# Patient Record
Sex: Male | Born: 1985 | Race: White | Hispanic: No | Marital: Married | Smoking: Light tobacco smoker
Health system: Southern US, Community
[De-identification: ages and names within clinical notes are randomized; demographics above are authoritative.]

## PROBLEM LIST (undated history)

## (undated) DIAGNOSIS — E669 Obesity, unspecified: Secondary | ICD-10-CM

## (undated) DIAGNOSIS — I10 Essential (primary) hypertension: Secondary | ICD-10-CM

## (undated) HISTORY — DX: Essential (primary) hypertension: I10

---

## 2014-08-28 ENCOUNTER — Ambulatory Visit (INDEPENDENT_AMBULATORY_CARE_PROVIDER_SITE_OTHER): Payer: BLUE CROSS/BLUE SHIELD | Admitting: Family Medicine

## 2014-08-28 VITALS — BP 164/102 | HR 96 | Temp 98.9°F | Resp 16 | Ht 71.0 in | Wt 313.4 lb

## 2014-08-28 DIAGNOSIS — E781 Pure hyperglyceridemia: Secondary | ICD-10-CM

## 2014-08-28 DIAGNOSIS — R739 Hyperglycemia, unspecified: Secondary | ICD-10-CM

## 2014-08-28 DIAGNOSIS — I10 Essential (primary) hypertension: Secondary | ICD-10-CM

## 2014-08-28 DIAGNOSIS — E669 Obesity, unspecified: Secondary | ICD-10-CM

## 2014-08-28 LAB — POCT GLYCOSYLATED HEMOGLOBIN (HGB A1C): HEMOGLOBIN A1C: 5.6

## 2014-08-28 LAB — GLUCOSE, POCT (MANUAL RESULT ENTRY): POC Glucose: 90 mg/dl (ref 70–99)

## 2014-08-28 MED ORDER — HYDROCHLOROTHIAZIDE 12.5 MG PO CAPS: 12.5000 mg | ORAL_CAPSULE | Freq: Every day | ORAL | Status: DC

## 2014-08-28 NOTE — Progress Notes (Addendum)
Subjective:    Patient ID: Jeremiah Caldwell, male    DOB: 07/31/1986, 29 y.o.   MRN: [id number]  This chart was scribed for Meredith Staggers, MD by Murriel Hopper, ED Scribe. The patient's care was started at 9:22 PM.   HPI  HPI Comments: Jeremiah Caldwell is a 29 y.o. male who presents for elevated blood pressure. Presents with labs from work lab draw, which was collected June 24, 2014. Creatinine was 1.05 on labs in November from work. Pt states that he went to the dentist 2 weeks ago, where his BP was measured at 165/96, and his dentist recommended that he see a physician. Pt states that he has never been treated for high blood pressure. Pt states that he does not know his FHx of HTN, and only knows that his grandfather had MI around 29 years old. 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 Pt states his weight has gone up in the past year. Pt states that he has done less physical activity at his job in the past 6 months, with more physical activity and exercise before he switched jobs. Pt also notes having headaches frequently, especially when he wakes up in the morning, for which he takes 800 mg Ibuprofen around 4x per week. Pt notes that he has SOB with physical exertion, but denies SOB at rest. Pt denies nocturnal dyspnea, has no PMD, and notes that he lays on two pillows when he sleeps. Rare alcohol use; not regular.    Hyperglycemia: Glucose 105 on labs in November  Hypertriglyceridemia: 218 on November labs  Review of Systems  Constitutional: Negative for fatigue and unexpected weight change.  Eyes: Negative for visual disturbance.  Respiratory: Negative for cough, chest tightness and shortness of breath.   Cardiovascular: Negative for chest pain, palpitations and leg swelling.  Gastrointestinal: Negative for abdominal pain and blood in stool.  Neurological: Negative for dizziness, light-headedness and headaches.       Objective:   Physical Exam  Constitutional: He is oriented to  person, place, and time. He appears well-developed and well-nourished.  HENT:  Head: Normocephalic and atraumatic.  Eyes: EOM are normal. Pupils are equal, round, and reactive to light.  Neck: No JVD present. Carotid bruit is not present.  Cardiovascular: Normal rate, regular rhythm and normal heart sounds.   No murmur heard. Pulmonary/Chest: Effort normal and breath sounds normal. He has no rales.  Musculoskeletal: He exhibits no edema.  Neurological: He is alert and oriented to person, place, and time.  Negative Romberg No pronator drift Negative heel-to-toe  Skin: Skin is warm and dry.  Psychiatric: He has a normal mood and affect.  Vitals reviewed.   Filed Vitals:   08/28/14 2019  BP: 164/102  Pulse: 96  Temp: 98.9 F (37.2 C)  TempSrc: Oral  Resp: 16  Height:  (1.803 m)  Weight: 313 lb 6 oz (142.146 kg)  SpO2: 98%    Results for orders placed or performed in visit on 08/28/14  POCT glucose (manual entry)  Result Value Ref Range   POC Glucose 90 70 - 99 mg/dl  POCT glycosylated hemoglobin (Hb A1C)  Result Value Ref Range   Hemoglobin A1C 5.6        Assessment & Plan:   Jeremiah Caldwell is a 29 y.o. male Essential hypertension - Plan: BASIC METABOLIC PANEL WITH GFR, hydrochlorothiazide (MICROZIDE) 12.5 MG capsule  - start hctz 12.5mg  qd. Labs pending. Recheck with outside readings in next 4 weeks.   -  wt loss, exercise discussed.  Hypertriglyceridemia, Obesity, Hyperglycemia- Plan: POCT glucose (manual entry), POCT glycosylated hemoglobin (Hb A1C)  -- borderline, but not yet prediabetic. Wt loss, exercise discussed. Plan on recheck lipids as wt improves.   Recheck in 4 weeks.      Meds ordered this encounter  Medications  . hydrochlorothiazide (MICROZIDE) 12.5 MG capsule    Sig: Take 1 capsule (12.5 mg total) by mouth daily.    Dispense:  30 capsule    Refill:  1   Patient Instructions  Start diuretic to lower blood pressure. Can also try to lower  salt in diet - see below.  Keep a record of your blood pressures outside of the office and bring them to the next office visit. Activity/exercise 150 minutes per week.  Avoid fast food, soft drinks, sweet tea.  Some form of fiber/complex carbohydrate and protein in the morning.  Recheck in next 4 weeks. We will call to schedule appointment with Dr. Neva Seat.  Return to the clinic or go to the nearest emergency room if any of your symptoms worsen or new symptoms occur.  DASH Eating Plan DASH stands for "Dietary Approaches to Stop Hypertension." The DASH eating plan is a healthy eating plan that has been shown to reduce high blood pressure (hypertension). Additional health benefits may include reducing the risk of type 2 diabetes mellitus, heart disease, and stroke. The DASH eating plan may also help with weight loss. WHAT DO I NEED TO KNOW ABOUT THE DASH EATING PLAN? For the DASH eating plan, you will follow these general guidelines:  Choose foods with a percent daily value for sodium of less than 5% (as listed on the food label).  Use salt-free seasonings or herbs instead of table salt or sea salt.  Check with your health care provider or pharmacist before using salt substitutes.  Eat lower-sodium products, often labeled as "lower sodium" or "no salt added."  Eat fresh foods.  Eat more vegetables, fruits, and low-fat dairy products.  Choose whole grains. Look for the word "whole" as the first word in the ingredient list.  Choose fish and skinless chicken or [place] more often than red meat. Limit fish, poultry, and meat to 6 oz (170 g) each day.  Limit sweets, desserts, sugars, and sugary drinks.  Choose heart-healthy fats.  Limit cheese to 1 oz (28 g) per day.  Eat more home-cooked food and less restaurant, buffet, and fast food.  Limit fried foods.  Cook foods using methods other than frying.  Limit canned vegetables. If you do use them, rinse them well to decrease the  sodium.  When eating at a restaurant, ask that your food be prepared with less salt, or no salt if possible. WHAT FOODS CAN I EAT? Seek help from a dietitian for individual calorie needs. Grains Whole grain or whole wheat bread. Brown rice. Whole grain or whole wheat pasta. Quinoa, bulgur, and whole grain cereals. Low-sodium cereals. Corn or whole wheat flour tortillas. Whole grain cornbread. Whole grain crackers. Low-sodium crackers. Vegetables Fresh or frozen vegetables (raw, steamed, roasted, or grilled). Low-sodium or reduced-sodium tomato and vegetable juices. Low-sodium or reduced-sodium tomato sauce and paste. Low-sodium or reduced-sodium canned vegetables.  Fruits All fresh, canned (in natural juice), or frozen fruits. Meat and Other Protein Products Ground beef (85% or leaner), grass-fed beef, or beef trimmed of fat. Skinless chicken or [place]. Ground chicken or [place]. Pork trimmed of fat. All fish and seafood. Eggs. Dried beans, peas, or lentils. Unsalted nuts and  seeds. Unsalted canned beans. Dairy Low-fat dairy products, such as skim or 1% milk, 2% or reduced-fat cheeses, low-fat ricotta or cottage cheese, or plain low-fat yogurt. Low-sodium or reduced-sodium cheeses. Fats and Oils Tub margarines without trans fats. Light or reduced-fat mayonnaise and salad dressings (reduced sodium). Avocado. Safflower, olive, or canola oils. Natural peanut or almond butter. Other Unsalted popcorn and pretzels. The items listed above may not be a complete list of recommended foods or beverages. Contact your dietitian for more options. WHAT FOODS ARE NOT RECOMMENDED? Grains White bread. White pasta. White rice. Refined cornbread. Bagels and croissants. Crackers that contain trans fat. Vegetables Creamed or fried vegetables. Vegetables in a cheese sauce. Regular canned vegetables. Regular canned tomato sauce and paste. Regular tomato and vegetable juices. Fruits Dried fruits. Canned fruit in  light or heavy syrup. Fruit juice. Meat and Other Protein Products Fatty cuts of meat. Ribs, chicken wings, bacon, sausage, bologna, salami, chitterlings, fatback, hot dogs, bratwurst, and packaged luncheon meats. Salted nuts and seeds. Canned beans with salt. Dairy Whole or 2% milk, cream, half-and-half, and cream cheese. Whole-fat or sweetened yogurt. Full-fat cheeses or blue cheese. Nondairy creamers and whipped toppings. Processed cheese, cheese spreads, or cheese curds. Condiments Onion and garlic salt, seasoned salt, table salt, and sea salt. Canned and packaged gravies. Worcestershire sauce. Tartar sauce. Barbecue sauce. Teriyaki sauce. Soy sauce, including reduced sodium. Steak sauce. Fish sauce. Oyster sauce. Cocktail sauce. Horseradish. Ketchup and mustard. Meat flavorings and tenderizers. Bouillon cubes. Hot sauce. Tabasco sauce. Marinades. Taco seasonings. Relishes. Fats and Oils Butter, stick margarine, lard, shortening, ghee, and bacon fat. Coconut, palm kernel, or palm oils. Regular salad dressings. Other Pickles and olives. Salted popcorn and pretzels. The items listed above may not be a complete list of foods and beverages to avoid. Contact your dietitian for more information. WHERE CAN I FIND MORE INFORMATION? National Heart, Lung, and Blood Institute: CablePromo.itwww.nhlbi.nih.gov/health/health-topics/topics/dash/ Document Released: 07/08/2011 Document Revised: 12/03/2013 Document Reviewed: 05/23/2013 [facility] Patient Information 2015 [place], [place]. This information is not intended to replace advice given to you by your health care provider. Make sure you discuss any questions you have with your health care provider.     I personally performed the services described in this documentation, which was scribed in my presence. The recorded information has been reviewed and considered, and addended by me as needed.

## 2014-08-28 NOTE — Patient Instructions (Addendum)
Start diuretic to lower blood pressure. Can also try to lower salt in diet - see below.  Keep a record of your blood pressures outside of the office and bring them to the next office visit. Activity/exercise 150 minutes per week.  Avoid fast food, soft drinks, sweet tea.  Some form of fiber/complex carbohydrate and protein in the morning.  Recheck in next 4 weeks. We will call to schedule appointment with Dr. Neva SeatGreene.  Return to the clinic or go to the nearest emergency room if any of your symptoms worsen or new symptoms occur.  DASH Eating Plan DASH stands for "Dietary Approaches to Stop Hypertension." The DASH eating plan is a healthy eating plan that has been shown to reduce high blood pressure (hypertension). Additional health benefits may include reducing the risk of type 2 diabetes mellitus, heart disease, and stroke. The DASH eating plan may also help with weight loss. WHAT DO I NEED TO KNOW ABOUT THE DASH EATING PLAN? For the DASH eating plan, you will follow these general guidelines:  Choose foods with a percent daily value for sodium of less than 5% (as listed on the food label).  Use salt-free seasonings or herbs instead of table salt or sea salt.  Check with your health care provider or pharmacist before using salt substitutes.  Eat lower-sodium products, often labeled as "lower sodium" or "no salt added."  Eat fresh foods.  Eat more vegetables, fruits, and low-fat dairy products.  Choose whole grains. Look for the word "whole" as the first word in the ingredient list.  Choose fish and skinless chicken or [place] more often than red meat. Limit fish, poultry, and meat to 6 oz (170 g) each day.  Limit sweets, desserts, sugars, and sugary drinks.  Choose heart-healthy fats.  Limit cheese to 1 oz (28 g) per day.  Eat more home-cooked food and less restaurant, buffet, and fast food.  Limit fried foods.  Cook foods using methods other than frying.  Limit canned  vegetables. If you do use them, rinse them well to decrease the sodium.  When eating at a restaurant, ask that your food be prepared with less salt, or no salt if possible. WHAT FOODS CAN I EAT? Seek help from a dietitian for individual calorie needs. Grains Whole grain or whole wheat bread. Brown rice. Whole grain or whole wheat pasta. Quinoa, bulgur, and whole grain cereals. Low-sodium cereals. Corn or whole wheat flour tortillas. Whole grain cornbread. Whole grain crackers. Low-sodium crackers. Vegetables Fresh or frozen vegetables (raw, steamed, roasted, or grilled). Low-sodium or reduced-sodium tomato and vegetable juices. Low-sodium or reduced-sodium tomato sauce and paste. Low-sodium or reduced-sodium canned vegetables.  Fruits All fresh, canned (in natural juice), or frozen fruits. Meat and Other Protein Products Ground beef (85% or leaner), grass-fed beef, or beef trimmed of fat. Skinless chicken or [place]. Ground chicken or [place]. Pork trimmed of fat. All fish and seafood. Eggs. Dried beans, peas, or lentils. Unsalted nuts and seeds. Unsalted canned beans. Dairy Low-fat dairy products, such as skim or 1% milk, 2% or reduced-fat cheeses, low-fat ricotta or cottage cheese, or plain low-fat yogurt. Low-sodium or reduced-sodium cheeses. Fats and Oils Tub margarines without trans fats. Light or reduced-fat mayonnaise and salad dressings (reduced sodium). Avocado. Safflower, olive, or canola oils. Natural peanut or almond butter. Other Unsalted popcorn and pretzels. The items listed above may not be a complete list of recommended foods or beverages. Contact your dietitian for more options. WHAT FOODS ARE NOT RECOMMENDED? Grains White  bread. White pasta. White rice. Refined cornbread. Bagels and croissants. Crackers that contain trans fat. Vegetables Creamed or fried vegetables. Vegetables in a cheese sauce. Regular canned vegetables. Regular canned tomato sauce and paste. Regular tomato  and vegetable juices. Fruits Dried fruits. Canned fruit in light or heavy syrup. Fruit juice. Meat and Other Protein Products Fatty cuts of meat. Ribs, chicken wings, bacon, sausage, bologna, salami, chitterlings, fatback, hot dogs, bratwurst, and packaged luncheon meats. Salted nuts and seeds. Canned beans with salt. Dairy Whole or 2% milk, cream, half-and-half, and cream cheese. Whole-fat or sweetened yogurt. Full-fat cheeses or blue cheese. Nondairy creamers and whipped toppings. Processed cheese, cheese spreads, or cheese curds. Condiments Onion and garlic salt, seasoned salt, table salt, and sea salt. Canned and packaged gravies. Worcestershire sauce. Tartar sauce. Barbecue sauce. Teriyaki sauce. Soy sauce, including reduced sodium. Steak sauce. Fish sauce. Oyster sauce. Cocktail sauce. Horseradish. Ketchup and mustard. Meat flavorings and tenderizers. Bouillon cubes. Hot sauce. Tabasco sauce. Marinades. Taco seasonings. Relishes. Fats and Oils Butter, stick margarine, lard, shortening, ghee, and bacon fat. Coconut, palm kernel, or palm oils. Regular salad dressings. Other Pickles and olives. Salted popcorn and pretzels. The items listed above may not be a complete list of foods and beverages to avoid. Contact your dietitian for more information. WHERE CAN I FIND MORE INFORMATION? National Heart, Lung, and Blood Institute: CablePromo.it Document Released: 07/08/2011 Document Revised: 12/03/2013 Document Reviewed: 05/23/2013 [facility] Patient Information 2015 [place], [place]. This information is not intended to replace advice given to you by your health care provider. Make sure you discuss any questions you have with your health care provider.

## 2014-08-29 LAB — BASIC METABOLIC PANEL WITH GFR
BUN: 13 mg/dL (ref 6–23)
CALCIUM: 10 mg/dL (ref 8.4–10.5)
CO2: 23 mEq/L (ref 19–32)
CREATININE: 0.97 mg/dL (ref 0.50–1.35)
Chloride: 105 mEq/L (ref 96–112)
GFR, Est African American: >89 mL/min
Glucose, Bld: 97 mg/dL (ref 70–99)
Potassium: 4.3 mEq/L (ref 3.5–5.3)
SODIUM: 141 meq/L (ref 135–145)

## 2014-10-15 ENCOUNTER — Ambulatory Visit (INDEPENDENT_AMBULATORY_CARE_PROVIDER_SITE_OTHER): Payer: BLUE CROSS/BLUE SHIELD

## 2014-10-15 ENCOUNTER — Ambulatory Visit (INDEPENDENT_AMBULATORY_CARE_PROVIDER_SITE_OTHER): Payer: BLUE CROSS/BLUE SHIELD | Admitting: Emergency Medicine

## 2014-10-15 VITALS — BP 132/90 | HR 99 | Temp 98.4°F | Resp 16 | Ht 71.0 in | Wt 310.4 lb

## 2014-10-15 DIAGNOSIS — R1084 Generalized abdominal pain: Secondary | ICD-10-CM

## 2014-10-15 DIAGNOSIS — E669 Obesity, unspecified: Secondary | ICD-10-CM | POA: Diagnosis not present

## 2014-10-15 DIAGNOSIS — I10 Essential (primary) hypertension: Secondary | ICD-10-CM

## 2014-10-15 LAB — POCT CBC
Granulocyte percent: 67.4 %G (ref 37–80)
HEMATOCRIT: 49.3 % (ref 43.5–53.7)
Hemoglobin: 15.7 g/dL (ref 14.1–18.1)
LYMPH, POC: 3 (ref 0.6–3.4)
MCH, POC: 28 pg (ref 27–31.2)
MCHC: 31.9 g/dL (ref 31.8–35.4)
MCV: 87.7 fL (ref 80–97)
MID (CBC): 0.4 (ref 0–0.9)
MPV: 8.9 fL (ref 0–99.8)
POC GRANULOCYTE: 7 — AB (ref 2–6.9)
POC LYMPH %: 28.9 % (ref 10–50)
POC MID %: 3.7 % (ref 0–12)
Platelet Count, POC: 270 10*3/uL (ref 142–424)
RBC: 5.62 M/uL (ref 4.69–6.13)
RDW, POC: 13 %
WBC: 10.4 10*3/uL — AB (ref 4.6–10.2)

## 2014-10-15 LAB — POCT URINALYSIS DIPSTICK
BILIRUBIN UA: NEGATIVE
Blood, UA: NEGATIVE
GLUCOSE UA: NEGATIVE
Ketones, UA: NEGATIVE
LEUKOCYTES UA: NEGATIVE
Nitrite, UA: NEGATIVE
Protein, UA: NEGATIVE
Spec Grav, UA: 1.01
UROBILINOGEN UA: 0.2
pH, UA: 5.5

## 2014-10-15 LAB — POCT UA - MICROSCOPIC ONLY
BACTERIA, U MICROSCOPIC: NEGATIVE
CASTS, UR, LPF, POC: NEGATIVE
Crystals, Ur, HPF, POC: NEGATIVE
RBC, urine, microscopic: NEGATIVE
Yeast, UA: NEGATIVE

## 2014-10-15 MED ORDER — HYDROCHLOROTHIAZIDE 25 MG PO TABS: 25.0000 mg | ORAL_TABLET | Freq: Every day | ORAL | Status: AC

## 2014-10-15 NOTE — Progress Notes (Addendum)
[facility] [address], [place] [id number] [phone]  Date:  10/15/2014   Name:  Jeremiah Caldwell Shukla   DOB:  1986/05/19   MRN:  [id number]  PCP:  No PCP Per Patient    Chief Complaint: Abdominal Pain; Back Pain; Medication Refill; and Nausea   History of Present Illness:  Jeremiah Caldwell Radin is a 29 y.o. very pleasant male patient who presents with the following:  Saturday night drank a 6 pack of beer.  Rarely drinks Since Sunday has experienced pain across the abdomen associated with nausea but no vomiting.  No stool change Moved his bowels Sunday but not after. Excess aspirin and caffeine. No food intolerance.   No heartburn or indigestion The patient has no complaint of blood, mucous, or pus in her stools. 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 No improvement with over the counter medications or other home remedies.  Denies other complaint or health concern today.   There are no active problems to display for this patient.   Past Medical History  Diagnosis Date  . Hypertension     History reviewed. No pertinent past surgical history.  History  Substance Use Topics  . Smoking status: Current Every Day Smoker  . Smokeless tobacco: Never Used  . Alcohol Use: No    Family History  Problem Relation Age of Onset  . Diabetes Sister   . Diabetes Maternal Grandmother   . Cancer Maternal Grandfather   . Heart disease Maternal Grandfather   . Hyperlipidemia Maternal Grandfather   . Hypertension Maternal Grandfather     No Known Allergies  Medication list has been reviewed and updated.  Current Outpatient Prescriptions on File Prior to Visit  Medication Sig Dispense Refill  . hydrochlorothiazide (MICROZIDE) 12.5 MG capsule Take 1 capsule (12.5 mg total) by mouth daily. 30 capsule 1   No current facility-administered medications on file prior to visit.    Review of Systems:  As per HPI, otherwise negative.    Physical Examination: Filed Vitals:   10/15/14 1436  BP: 132/90  Pulse: 99  Temp: 98.4 F (36.9 C)  Resp: 16   Filed Vitals:   10/15/14 1436  Height: 5\' 11"  (1.803 m)  Weight: 310 lb 6.4 oz (140.797 kg)   Body mass index is 43.31 kg/(m^2). Ideal Body Weight: Weight in (lb) to have BMI = 25: 178.9  GEN: morbid obesity, NAD, Non-toxic, A & O x 3 HEENT: Atraumatic, Normocephalic. Neck supple. No masses, No LAD. Ears and Nose: No external deformity. CV: RRR, No M/G/R. No JVD. No thrill. No extra heart sounds. PULM: CTA B, no wheezes, crackles, rhonchi. No retractions. No resp. distress. No accessory muscle use. ABD: S, NT, ND, +BS. No rebound. No HSM. EXTR: No c/c/e NEURO Normal gait.  PSYCH: Normally interactive. Conversant. Not depressed or anxious appearing.  Calm demeanor.    Assessment and Plan: Hypertension Abdominal pain Await labs   Signed,  Phillips OdorJeffery Jaslen Adcox, MD  Results for orders placed or performed in visit on 10/15/14  POCT CBC  Result Value Ref Range   WBC 10.4 (A) 4.6 - 10.2 K/uL   Lymph, poc 3.0 0.6 - 3.4   POC LYMPH PERCENT 28.9 10 - 50 %L   MID (cbc) 0.4 0 - 0.9   POC MID % 3.7 0 - 12 %M   POC Granulocyte 7.0 (A) 2 - 6.9   Granulocyte percent 67.4 37 - 80 %G   RBC 5.62 4.69 - 6.13 M/uL   Hemoglobin  15.7 14.1 - 18.1 g/dL   HCT, POC 40.9 81.1 - 53.7 %   MCV 87.7 80 - 97 fL   MCH, POC 28.0 27 - 31.2 pg   MCHC 31.9 31.8 - 35.4 g/dL   RDW, POC 91.4 %   Platelet Count, POC 270 142 - 424 K/uL   MPV 8.9 0 - 99.8 fL  POCT urinalysis dipstick  Result Value Ref Range   Color, UA yellow    Clarity, UA clear    Glucose, UA neg    Bilirubin, UA neg    Ketones, UA neg    Spec Grav, UA 1.010    Blood, UA neg    pH, UA 5.5    Protein, UA neg    Urobilinogen, UA 0.2    Nitrite, UA neg    Leukocytes, UA Negative   POCT UA - Microscopic Only  Result Value Ref Range   WBC, Ur, HPF, POC 0-3    RBC, urine, microscopic neg    Bacteria, U Microscopic neg    Mucus, UA trace    Epithelial  cells, urine per micros 0-3    Crystals, Ur, HPF, POC neg    Casts, Ur, LPF, POC neg    Yeast, UA neg    [facility] reading (PRIMARY) by  Dr. Dareen Piano. negative.

## 2014-10-15 NOTE — Patient Instructions (Signed)
Hypertension Hypertension, commonly called high blood pressure, is when the force of blood pumping through your arteries is too strong. Your arteries are the blood vessels that carry blood from your heart throughout your body. A blood pressure reading consists of a higher number over a lower number, such as 110/72. The higher number (systolic) is the pressure inside your arteries when your heart pumps. The lower number (diastolic) is the pressure inside your arteries when your heart relaxes. Ideally you want your blood pressure below 120/80. Hypertension forces your heart to work harder to pump blood. Your arteries may become narrow or stiff. Having hypertension puts you at risk for heart disease, stroke, and other problems.  RISK FACTORS Some risk factors for high blood pressure are controllable. Others are not.  Risk factors you cannot control include:   Race. You may be at higher risk if you are African American.  Age. Risk increases with age.  Gender. Men are at higher risk than women before age 45 years. After age 65, women are at higher risk than men. Risk factors you can control include:  Not getting enough exercise or physical activity.  Being overweight.  Getting too much fat, sugar, calories, or salt in your diet.  Drinking too much alcohol. SIGNS AND SYMPTOMS Hypertension does not usually cause signs or symptoms. Extremely high blood pressure (hypertensive crisis) may cause headache, anxiety, shortness of breath, and nosebleed. DIAGNOSIS  To check if you have hypertension, your health care provider will measure your blood pressure while you are seated, with your arm held at the level of your heart. It should be measured at least twice using the same arm. Certain conditions can cause a difference in blood pressure between your right and left arms. A blood pressure reading that is higher than normal on one occasion does not mean that you need treatment. If one blood pressure reading  is high, ask your health care provider about having it checked again. TREATMENT  Treating high blood pressure includes making lifestyle changes and possibly taking medicine. Living a healthy lifestyle can help lower high blood pressure. You may need to change some of your habits. Lifestyle changes may include:  Following the DASH diet. This diet is high in fruits, vegetables, and whole grains. It is low in salt, red meat, and added sugars.  Getting at least 2 hours of brisk physical activity every week.  Losing weight if necessary.  Not smoking.  Limiting alcoholic beverages.  Learning ways to reduce stress. If lifestyle changes are not enough to get your blood pressure under control, your health care provider may prescribe medicine. You may need to take more than one. Work closely with your health care provider to understand the risks and benefits. HOME CARE INSTRUCTIONS  Have your blood pressure rechecked as directed by your health care provider.   Take medicines only as directed by your health care provider. Follow the directions carefully. Blood pressure medicines must be taken as prescribed. The medicine does not work as well when you skip doses. Skipping doses also puts you at risk for problems.   Do not smoke.   Monitor your blood pressure at home as directed by your health care provider. SEEK MEDICAL CARE IF:   You think you are having a reaction to medicines taken.  You have recurrent headaches or feel dizzy.  You have swelling in your ankles.  You have trouble with your vision. SEEK IMMEDIATE MEDICAL CARE IF:  You develop a severe headache or confusion.    You have unusual weakness, numbness, or feel faint.  You have severe chest or abdominal pain.  You vomit repeatedly.  You have trouble breathing. MAKE SURE YOU:   Understand these instructions.  Will watch your condition.  Will get help right away if you are not doing well or get worse. Document  Released: 07/19/2005 Document Revised: 12/03/2013 Document Reviewed: 05/11/2013 [facility] Patient Information 2015 [place], [place]. This information is not intended to replace advice given to you by your health care provider. Make sure you discuss any questions you have with your health care provider. Abdominal Pain Many things can cause abdominal pain. Usually, abdominal pain is not caused by a disease and will improve without treatment. It can often be observed and treated at home. Your health care provider will do a physical exam and possibly order blood tests and X-rays to help determine the seriousness of your pain. However, in many cases, more time must pass before a clear cause of the pain can be found. Before that point, your health care provider may not know if you need more testing or further treatment. HOME CARE INSTRUCTIONS  Monitor your abdominal pain for any changes. The following actions may help to alleviate any discomfort you are experiencing:  Only take over-the-counter or prescription medicines as directed by your health care provider.  Do not take laxatives unless directed to do so by your health care provider.  Try a clear liquid diet (broth, tea, or water) as directed by your health care provider. Slowly move to a bland diet as tolerated. SEEK MEDICAL CARE IF:  You have unexplained abdominal pain.  You have abdominal pain associated with nausea or diarrhea.  You have pain when you urinate or have a bowel movement.  You experience abdominal pain that wakes you in the night.  You have abdominal pain that is worsened or improved by eating food.  You have abdominal pain that is worsened with eating fatty foods.  You have a fever. SEEK IMMEDIATE MEDICAL CARE IF:   Your pain does not go away within 2 hours.  You keep throwing up (vomiting).  Your pain is felt only in portions of the abdomen, such as the right side or the left lower portion of the abdomen.  You pass  bloody or black tarry stools. MAKE SURE YOU:  Understand these instructions.   Will watch your condition.   Will get help right away if you are not doing well or get worse.  Document Released: 04/28/2005 Document Revised: 07/24/2013 Document Reviewed: 03/28/2013 [facility] Patient Information 2015 [place], [place]. This information is not intended to replace advice given to you by your health care provider. Make sure you discuss any questions you have with your health care provider.

## 2014-10-16 LAB — COMPREHENSIVE METABOLIC PANEL
ALBUMIN: 4.4 g/dL (ref 3.5–5.2)
ALK PHOS: 66 U/L (ref 39–117)
ALT: 35 U/L (ref 0–53)
AST: 17 U/L (ref 0–37)
BILIRUBIN TOTAL: 0.5 mg/dL (ref 0.2–1.2)
BUN: 10 mg/dL (ref 6–23)
CO2: 25 meq/L (ref 19–32)
Calcium: 9.4 mg/dL (ref 8.4–10.5)
Chloride: 102 mEq/L (ref 96–112)
Creat: 0.83 mg/dL (ref 0.50–1.35)
GLUCOSE: 82 mg/dL (ref 70–99)
POTASSIUM: 3.7 meq/L (ref 3.5–5.3)
Sodium: 139 mEq/L (ref 135–145)
Total Protein: 7.3 g/dL (ref 6.0–8.3)

## 2014-10-16 LAB — LIPASE: LIPASE: 15 U/L (ref 0–75)

## 2014-10-16 LAB — AMYLASE: Amylase: 27 U/L (ref 0–105)

## 2014-10-28 ENCOUNTER — Ambulatory Visit: Payer: BLUE CROSS/BLUE SHIELD | Admitting: Family Medicine

## 2016-10-02 ENCOUNTER — Encounter (HOSPITAL_BASED_OUTPATIENT_CLINIC_OR_DEPARTMENT_OTHER): Payer: Self-pay | Admitting: Emergency Medicine

## 2016-10-02 ENCOUNTER — Emergency Department (HOSPITAL_BASED_OUTPATIENT_CLINIC_OR_DEPARTMENT_OTHER): Payer: BLUE CROSS/BLUE SHIELD

## 2016-10-02 ENCOUNTER — Observation Stay (HOSPITAL_BASED_OUTPATIENT_CLINIC_OR_DEPARTMENT_OTHER)
Admission: EM | Admit: 2016-10-02 | Discharge: 2016-10-04 | Disposition: A | Discharge status: Home / Self Care | Payer: BLUE CROSS/BLUE SHIELD | Attending: General Surgery | Admitting: General Surgery

## 2016-10-02 DIAGNOSIS — F17210 Nicotine dependence, cigarettes, uncomplicated: Secondary | ICD-10-CM | POA: Diagnosis not present

## 2016-10-02 DIAGNOSIS — K3580 Unspecified acute appendicitis: Secondary | ICD-10-CM | POA: Diagnosis not present

## 2016-10-02 DIAGNOSIS — R109 Unspecified abdominal pain: Secondary | ICD-10-CM | POA: Diagnosis present

## 2016-10-02 DIAGNOSIS — I10 Essential (primary) hypertension: Secondary | ICD-10-CM | POA: Insufficient documentation

## 2016-10-02 DIAGNOSIS — Z6841 Body Mass Index (BMI) 40.0 and over, adult: Secondary | ICD-10-CM | POA: Diagnosis not present

## 2016-10-02 HISTORY — DX: Obesity, unspecified: E66.9

## 2016-10-02 LAB — CBC WITH DIFFERENTIAL/PLATELET
Basophils Absolute: 0 10*3/uL (ref 0.0–0.1)
Basophils Relative: 0 %
Eosinophils Absolute: 0 10*3/uL (ref 0.0–0.7)
Eosinophils Relative: 0 %
HEMATOCRIT: 39.4 % (ref 39.0–52.0)
HEMOGLOBIN: 13.8 g/dL (ref 13.0–17.0)
LYMPHS ABS: 1.4 10*3/uL (ref 0.7–4.0)
Lymphocytes Relative: 8 %
MCH: 29.6 pg (ref 26.0–34.0)
MCHC: 35 g/dL (ref 30.0–36.0)
MCV: 84.5 fL (ref 78.0–100.0)
Monocytes Absolute: 1 10*3/uL (ref 0.1–1.0)
Monocytes Relative: 6 %
NEUTROS ABS: 14.4 10*3/uL — AB (ref 1.7–7.7)
NEUTROS PCT: 86 %
Platelets: 250 10*3/uL (ref 150–400)
RBC: 4.66 MIL/uL (ref 4.22–5.81)
RDW: 13.1 % (ref 11.5–15.5)
WBC: 16.9 10*3/uL — AB (ref 4.0–10.5)

## 2016-10-02 LAB — URINALYSIS, ROUTINE W REFLEX MICROSCOPIC
BILIRUBIN URINE: NEGATIVE
Glucose, UA: NEGATIVE mg/dL
Hgb urine dipstick: NEGATIVE
Ketones, ur: NEGATIVE mg/dL
LEUKOCYTES UA: NEGATIVE
NITRITE: NEGATIVE
PH: 5 (ref 5.0–8.0)
Protein, ur: NEGATIVE mg/dL
Specific Gravity, Urine: 1.021 (ref 1.005–1.030)

## 2016-10-02 MED ORDER — ONDANSETRON HCL 4 MG/2ML IJ SOLN
4.0000 mg | Freq: Once | INTRAMUSCULAR | Status: AC
  Administered 2016-10-02: 4 mg via INTRAVENOUS
  Filled 2016-10-02: qty 2

## 2016-10-02 MED ORDER — SODIUM CHLORIDE 0.9 % IV BOLUS (SEPSIS)
1000.0000 mL | Freq: Once | INTRAVENOUS | Status: AC
  Administered 2016-10-02: 1000 mL via INTRAVENOUS

## 2016-10-02 MED ORDER — MORPHINE SULFATE (PF) 4 MG/ML IV SOLN
4.0000 mg | Freq: Once | INTRAVENOUS | Status: AC
Start: 2016-10-02 — End: 2016-10-02
  Administered 2016-10-02: 4 mg via INTRAVENOUS
  Filled 2016-10-02: qty 1

## 2016-10-02 NOTE — ED Provider Notes (Signed)
[facility] Note   CSN: [id number] Arrival date & time: 10/02/16  2213   By signing my name below, I, [name], attest that this documentation has been prepared under the direction and in the presence of Geoffery Lyons, MD  Electronically Signed: [name], ED Scribe. 10/02/16. 11:33 PM.   History   Chief Complaint Chief Complaint  Patient presents with  . Abdominal Pain   The history is provided by the patient. No language interpreter was used.  Abdominal Pain   This is a new problem. The current episode started 6 to 12 hours ago. The problem occurs constantly. The problem has not changed since onset.The pain is associated with an unknown factor. The pain is located in the RLQ. The pain is moderate. Associated symptoms include nausea and vomiting. Pertinent negatives include diarrhea and constipation. The symptoms are aggravated by palpation. Nothing relieves the symptoms.   HPI Comments:  Jeremiah Caldwell is a 31 y.o. male, with a hx of HTN on hydrochlorothiazide, who presents to the Emergency Department complaining of acute onset, constant, moderate, RLQ abdominal pain onset 5 PM today. His pain is worse upon palpation. Pt also reports nausea, 3 episodes of vomiting and chills. Pt denies diarrhea, constipation, melena, hematochezia, fevers, hx of surgeries, any recent sick contacts or any other associated symptoms.   Past Medical History:  Diagnosis Date  . Hypertension   . Obese     There are no active problems to display for this patient.   History reviewed. No pertinent surgical history.   Home Medications    Prior to Admission medications   Medication Sig Start Date End Date Taking? Authorizing Provider  hydrochlorothiazide (HYDRODIURIL) 25 MG tablet Take 1 tablet (25 mg total) by mouth daily. 10/15/14  Yes Carmelina Dane, MD  hydrochlorothiazide (MICROZIDE) 12.5 MG capsule Take 1 capsule (12.5 mg total) by mouth daily. 08/28/14   Shade Flood, MD    Family History Family History  Problem Relation Age of Onset  . Diabetes Sister   . Diabetes Maternal Grandmother   . Cancer Maternal Grandfather   . Heart disease Maternal Grandfather   . Hyperlipidemia Maternal Grandfather   . Hypertension Maternal Grandfather     Social History Social History  Substance Use Topics  . Smoking status: Light Tobacco Smoker    Types: Cigarettes  . Smokeless tobacco: Never Used  . Alcohol use No     Allergies   Patient has no allergy information on record.   Review of Systems Review of Systems  Constitutional: Positive for chills.  Gastrointestinal: Positive for abdominal pain, nausea and vomiting. Negative for blood in stool, constipation and diarrhea.  All other systems reviewed and are negative.  Physical Exam Updated Vital Signs BP 153/91 (BP Location: Right Arm)   Pulse 91   Temp 98.8 F (37.1 C) (Oral)   Resp 19   Ht  (1.854 m)   Wt (!) 320 lb (145.2 kg)   SpO2 100%   BMI 42.22 kg/m   Physical Exam  Constitutional: He is oriented to person, place, and time. He appears well-developed and well-nourished.  HENT:  Head: Normocephalic and atraumatic.  Eyes: EOM are normal.  Neck: Normal range of motion.  Cardiovascular: Normal rate, regular rhythm, normal heart sounds and intact distal pulses.   Pulmonary/Chest: Effort normal and breath sounds normal. No respiratory distress.  Abdominal: Soft. He exhibits no distension. There is tenderness in the right lower quadrant. There is no rebound  and no guarding.  TTP in RLQ  Musculoskeletal: Normal range of motion.  Neurological: He is alert and oriented to person, place, and time.  Skin: Skin is warm and dry.  Psychiatric: He has a normal mood and affect. Judgment normal.  Nursing note and vitals reviewed.  ED Treatments / Results  DIAGNOSTIC STUDIES:  Oxygen Saturation is 100% on RA, normal by my interpretation.    COORDINATION OF CARE:  11:29 PM  Discussed treatment plan with pt at bedside and pt agreed to plan.  Labs (all labs ordered are listed, but only abnormal results are displayed) Labs Reviewed  URINALYSIS, ROUTINE W REFLEX MICROSCOPIC    EKG  EKG Interpretation None       Radiology No results found.  Procedures Procedures (including critical care time)  Medications Ordered in ED Medications - No data to display   Initial Impression / Assessment and Plan / ED Course  I have reviewed the triage vital signs and the nursing notes.  Pertinent labs & imaging results that were available during my care of the patient were reviewed by me and considered in my medical decision making (see chart for details).  WBC is 17,000, and CT scan reveals acute appendicitis. I discussed this finding with Dr. Abbey Chatters from [facility]. surgery was recommending admission [facility]. He will perform surgery in the morning. In the meantime the patient will be given Rocephin and Flagyl and will be transferred there.  Final Clinical Impressions(s) / ED Diagnoses   Final diagnoses:  None    New Prescriptions New Prescriptions   No medications on file  I personally performed the services described in this documentation, which was scribed in my presence. The recorded information has been reviewed and is accurate.        Geoffery Lyons, MD 10/03/16 [phone]

## 2016-10-02 NOTE — ED Triage Notes (Signed)
Abd pain since 1700 today and then had 2 diarrhea stools and vomited x 3 . Took bentyl 10 mg and Zofran 4mg  , Pepto Bismal 2 tabs, with minimum relief

## 2016-10-03 ENCOUNTER — Encounter (HOSPITAL_COMMUNITY)
Admission: EM | Disposition: A | Discharge status: Home / Self Care | Payer: Self-pay | Source: Home / Self Care | Attending: Emergency Medicine

## 2016-10-03 ENCOUNTER — Observation Stay (HOSPITAL_COMMUNITY): Payer: BLUE CROSS/BLUE SHIELD | Admitting: Certified Registered Nurse Anesthetist

## 2016-10-03 ENCOUNTER — Encounter (HOSPITAL_COMMUNITY): Payer: Self-pay | Admitting: Certified Registered Nurse Anesthetist

## 2016-10-03 DIAGNOSIS — K3580 Unspecified acute appendicitis: Secondary | ICD-10-CM | POA: Diagnosis present

## 2016-10-03 HISTORY — PX: LAPAROSCOPIC APPENDECTOMY: SHX408

## 2016-10-03 LAB — SURGICAL PCR SCREEN
MRSA, PCR: NEGATIVE
STAPHYLOCOCCUS AUREUS: NEGATIVE

## 2016-10-03 LAB — CBC
HCT: 36.3 % — ABNORMAL LOW (ref 39.0–52.0)
Hemoglobin: 12.6 g/dL — ABNORMAL LOW (ref 13.0–17.0)
MCH: 29.5 pg (ref 26.0–34.0)
MCHC: 34.7 g/dL (ref 30.0–36.0)
MCV: 85 fL (ref 78.0–100.0)
PLATELETS: 214 10*3/uL (ref 150–400)
RBC: 4.27 MIL/uL (ref 4.22–5.81)
RDW: 13.4 % (ref 11.5–15.5)
WBC: 11.3 10*3/uL — ABNORMAL HIGH (ref 4.0–10.5)

## 2016-10-03 LAB — COMPREHENSIVE METABOLIC PANEL
ALT: 33 U/L (ref 17–63)
ANION GAP: 7 (ref 5–15)
AST: 23 U/L (ref 15–41)
Albumin: 4 g/dL (ref 3.5–5.0)
Alkaline Phosphatase: 50 U/L (ref 38–126)
BUN: 9 mg/dL (ref 6–20)
CHLORIDE: 103 mmol/L (ref 101–111)
CO2: 28 mmol/L (ref 22–32)
Calcium: 9.1 mg/dL (ref 8.9–10.3)
Creatinine, Ser: 0.87 mg/dL (ref 0.61–1.24)
GFR calc Af Amer: >60 mL/min (ref >60)
GFR calc non Af Amer: >60 mL/min (ref >60)
Glucose, Bld: 189 mg/dL — ABNORMAL HIGH (ref 65–99)
Potassium: 3.5 mmol/L (ref 3.5–5.1)
SODIUM: 138 mmol/L (ref 135–145)
Total Bilirubin: 0.7 mg/dL (ref 0.3–1.2)
Total Protein: 7.4 g/dL (ref 6.5–8.1)

## 2016-10-03 LAB — CREATININE, SERUM
CREATININE: 0.95 mg/dL (ref 0.61–1.24)
GFR calc Af Amer: >60 mL/min (ref >60)

## 2016-10-03 SURGERY — APPENDECTOMY, LAPAROSCOPIC
Anesthesia: General | Site: Abdomen

## 2016-10-03 MED ORDER — SUCCINYLCHOLINE CHLORIDE 20 MG/ML IJ SOLN
INTRAMUSCULAR | Status: DC | PRN
  Administered 2016-10-03: 140 mg via INTRAVENOUS

## 2016-10-03 MED ORDER — SODIUM CHLORIDE 0.9 % IV SOLN: INTRAVENOUS | Status: DC

## 2016-10-03 MED ORDER — MORPHINE SULFATE (PF) 4 MG/ML IV SOLN
2.0000 mg | INTRAVENOUS | Status: DC | PRN
  Administered 2016-10-03 (×2): 2 mg via INTRAVENOUS
  Filled 2016-10-03 (×2): qty 1

## 2016-10-03 MED ORDER — MIDAZOLAM HCL 5 MG/5ML IJ SOLN
INTRAMUSCULAR | Status: DC | PRN
  Administered 2016-10-03: 2 mg via INTRAVENOUS

## 2016-10-03 MED ORDER — FENTANYL CITRATE (PF) 250 MCG/5ML IJ SOLN
INTRAMUSCULAR | Status: AC
  Filled 2016-10-03: qty 5

## 2016-10-03 MED ORDER — ONDANSETRON 4 MG PO TBDP: 4.0000 mg | ORAL_TABLET | Freq: Four times a day (QID) | ORAL | Status: DC | PRN

## 2016-10-03 MED ORDER — PROMETHAZINE HCL 25 MG/ML IJ SOLN
12.5000 mg | Freq: Once | INTRAMUSCULAR | Status: AC
  Administered 2016-10-03: 12.5 mg via INTRAVENOUS
  Filled 2016-10-03: qty 1

## 2016-10-03 MED ORDER — HYDROMORPHONE HCL 1 MG/ML IJ SOLN: 1.0000 mg | INTRAMUSCULAR | Status: DC | PRN

## 2016-10-03 MED ORDER — METHOCARBAMOL 500 MG PO TABS
500.0000 mg | ORAL_TABLET | Freq: Three times a day (TID) | ORAL | Status: AC
  Administered 2016-10-03 (×3): 500 mg via ORAL
  Filled 2016-10-03 (×3): qty 1

## 2016-10-03 MED ORDER — SUGAMMADEX SODIUM 200 MG/2ML IV SOLN
INTRAVENOUS | Status: DC | PRN
  Administered 2016-10-03: 300 mg via INTRAVENOUS

## 2016-10-03 MED ORDER — LIDOCAINE 2% (20 MG/ML) 5 ML SYRINGE
INTRAMUSCULAR | Status: DC | PRN
  Administered 2016-10-03: 80 mg via INTRAVENOUS

## 2016-10-03 MED ORDER — DEXTROSE 5 % IV SOLN
1.0000 g | Freq: Once | INTRAVENOUS | Status: AC
  Administered 2016-10-03: 1 g via INTRAVENOUS
  Filled 2016-10-03 (×2): qty 10

## 2016-10-03 MED ORDER — LOSARTAN POTASSIUM-HCTZ 100-25 MG PO TABS: 1.0000 | ORAL_TABLET | Freq: Every day | ORAL | Status: DC

## 2016-10-03 MED ORDER — 0.9 % SODIUM CHLORIDE (POUR BTL) OPTIME
TOPICAL | Status: DC | PRN
  Administered 2016-10-03: 1000 mL

## 2016-10-03 MED ORDER — FENTANYL CITRATE (PF) 100 MCG/2ML IJ SOLN
INTRAMUSCULAR | Status: DC | PRN
  Administered 2016-10-03 (×2): 100 ug via INTRAVENOUS

## 2016-10-03 MED ORDER — SODIUM CHLORIDE 0.9 % IV SOLN
INTRAVENOUS | Status: DC
  Administered 2016-10-03: 04:00:00 via INTRAVENOUS

## 2016-10-03 MED ORDER — DEXMEDETOMIDINE HCL 200 MCG/2ML IV SOLN
INTRAVENOUS | Status: DC | PRN
  Administered 2016-10-03: 12 ug via INTRAVENOUS
  Administered 2016-10-03: 8 ug via INTRAVENOUS

## 2016-10-03 MED ORDER — LOSARTAN POTASSIUM 50 MG PO TABS
100.0000 mg | ORAL_TABLET | Freq: Every day | ORAL | Status: DC
  Administered 2016-10-03 – 2016-10-04 (×2): 100 mg via ORAL
  Filled 2016-10-03 (×2): qty 2

## 2016-10-03 MED ORDER — ROCURONIUM BROMIDE 10 MG/ML (PF) SYRINGE
PREFILLED_SYRINGE | INTRAVENOUS | Status: DC | PRN
  Administered 2016-10-03: 40 mg via INTRAVENOUS
  Administered 2016-10-03: 10 mg via INTRAVENOUS

## 2016-10-03 MED ORDER — ENOXAPARIN SODIUM 40 MG/0.4ML ~~LOC~~ SOLN
40.0000 mg | SUBCUTANEOUS | Status: DC
Start: 2016-10-04 — End: 2016-10-04
  Administered 2016-10-04: 08:00:00 40 mg via SUBCUTANEOUS
  Filled 2016-10-03: qty 0.4

## 2016-10-03 MED ORDER — SODIUM CHLORIDE 0.9 % IR SOLN
Status: DC | PRN
  Administered 2016-10-03: 1000 mL

## 2016-10-03 MED ORDER — BUPIVACAINE-EPINEPHRINE (PF) 0.25% -1:200000 IJ SOLN
INTRAMUSCULAR | Status: DC | PRN
  Administered 2016-10-03: 20 mL

## 2016-10-03 MED ORDER — PROPOFOL 10 MG/ML IV BOLUS
INTRAVENOUS | Status: AC
  Filled 2016-10-03: qty 20

## 2016-10-03 MED ORDER — ONDANSETRON HCL 4 MG/2ML IJ SOLN: 4.0000 mg | INTRAMUSCULAR | Status: DC | PRN

## 2016-10-03 MED ORDER — MIDAZOLAM HCL 2 MG/2ML IJ SOLN
INTRAMUSCULAR | Status: AC
  Filled 2016-10-03: qty 2

## 2016-10-03 MED ORDER — ONDANSETRON HCL 4 MG/2ML IJ SOLN
4.0000 mg | Freq: Three times a day (TID) | INTRAMUSCULAR | Status: DC | PRN
  Administered 2016-10-03: 4 mg via INTRAVENOUS

## 2016-10-03 MED ORDER — PROPOFOL 10 MG/ML IV BOLUS
INTRAVENOUS | Status: DC | PRN
Start: 2016-10-03 — End: 2016-10-03
  Administered 2016-10-03: 200 mg via INTRAVENOUS

## 2016-10-03 MED ORDER — ONDANSETRON HCL 4 MG/2ML IJ SOLN
INTRAMUSCULAR | Status: AC
  Filled 2016-10-03: qty 2

## 2016-10-03 MED ORDER — BUPIVACAINE-EPINEPHRINE 0.25% -1:200000 IJ SOLN
INTRAMUSCULAR | Status: AC
  Filled 2016-10-03: qty 1

## 2016-10-03 MED ORDER — DEXMEDETOMIDINE HCL IN NACL 200 MCG/50ML IV SOLN
INTRAVENOUS | Status: AC
  Filled 2016-10-03: qty 50

## 2016-10-03 MED ORDER — KCL IN DEXTROSE-NACL 20-5-0.9 MEQ/L-%-% IV SOLN
INTRAVENOUS | Status: DC
  Administered 2016-10-03 (×2): via INTRAVENOUS
  Filled 2016-10-03 (×3): qty 1000

## 2016-10-03 MED ORDER — HYDROCODONE-ACETAMINOPHEN 5-325 MG PO TABS
1.0000 | ORAL_TABLET | ORAL | Status: DC | PRN
  Administered 2016-10-03 – 2016-10-04 (×3): 2 via ORAL
  Filled 2016-10-03 (×3): qty 2

## 2016-10-03 MED ORDER — IOPAMIDOL (ISOVUE-300) INJECTION 61%
100.0000 mL | Freq: Once | INTRAVENOUS | Status: AC | PRN
  Administered 2016-10-03: 100 mL via INTRAVENOUS

## 2016-10-03 MED ORDER — PROMETHAZINE HCL 25 MG/ML IJ SOLN: 6.2500 mg | INTRAMUSCULAR | Status: DC | PRN

## 2016-10-03 MED ORDER — HYDROCHLOROTHIAZIDE 25 MG PO TABS
25.0000 mg | ORAL_TABLET | Freq: Every day | ORAL | Status: DC
  Administered 2016-10-03 – 2016-10-04 (×2): 25 mg via ORAL
  Filled 2016-10-03 (×2): qty 1

## 2016-10-03 MED ORDER — LACTATED RINGERS IV SOLN
INTRAVENOUS | Status: DC | PRN
  Administered 2016-10-03 (×2): via INTRAVENOUS

## 2016-10-03 MED ORDER — HYDROMORPHONE HCL 1 MG/ML IJ SOLN: 0.2500 mg | INTRAMUSCULAR | Status: DC | PRN

## 2016-10-03 MED ORDER — METRONIDAZOLE IN NACL 5-0.79 MG/ML-% IV SOLN
500.0000 mg | Freq: Once | INTRAVENOUS | Status: AC
  Administered 2016-10-03: 500 mg via INTRAVENOUS
  Filled 2016-10-03: qty 100

## 2016-10-03 SURGICAL SUPPLY — 46 items
APPLIER CLIP 5 13 M/L LIGAMAX5 (MISCELLANEOUS)
APPLIER CLIP ROT 10 11.4 M/L (STAPLE)
BENZOIN TINCTURE PRP APPL 2/3 (GAUZE/BANDAGES/DRESSINGS) ×3 IMPLANT
CABLE HIGH FREQUENCY MONO STRZ (ELECTRODE) ×3 IMPLANT
CHLORAPREP W/TINT 26ML (MISCELLANEOUS) ×3 IMPLANT
CLIP APPLIE 5 13 M/L LIGAMAX5 (MISCELLANEOUS) IMPLANT
CLIP APPLIE ROT 10 11.4 M/L (STAPLE) IMPLANT
CLOSURE STERI-STRIP 1/4X4 (GAUZE/BANDAGES/DRESSINGS) ×3 IMPLANT
CLOSURE WOUND 1/2 X4 (GAUZE/BANDAGES/DRESSINGS) ×1
COVER SURGICAL LIGHT HANDLE (MISCELLANEOUS) ×3 IMPLANT
CUTTER FLEX LINEAR 45M (STAPLE) ×3 IMPLANT
DECANTER SPIKE VIAL GLASS SM (MISCELLANEOUS) ×3 IMPLANT
DRAIN CHANNEL 19F RND (DRAIN) IMPLANT
DRAPE LAPAROSCOPIC ABDOMINAL (DRAPES) ×3 IMPLANT
DRSG TEGADERM 2-3/8X2-3/4 SM (GAUZE/BANDAGES/DRESSINGS) ×9 IMPLANT
DRSG TEGADERM 4X4.75 (GAUZE/BANDAGES/DRESSINGS) ×3 IMPLANT
ELECT REM PT RETURN 9FT ADLT (ELECTROSURGICAL) ×3
ELECTRODE REM PT RTRN 9FT ADLT (ELECTROSURGICAL) ×1 IMPLANT
ENDOLOOP SUT PDS II  0 18 (SUTURE)
ENDOLOOP SUT PDS II 0 18 (SUTURE) IMPLANT
EVACUATOR SILICONE 100CC (DRAIN) IMPLANT
GAUZE SPONGE 2X2 8PLY STRL LF (GAUZE/BANDAGES/DRESSINGS) ×1 IMPLANT
GLOVE ECLIPSE 8.0 STRL XLNG CF (GLOVE) ×3 IMPLANT
GLOVE INDICATOR 8.0 STRL GRN (GLOVE) ×3 IMPLANT
GOWN STRL REUS W/TWL XL LVL3 (GOWN DISPOSABLE) ×6 IMPLANT
IRRIG SUCT STRYKERFLOW 2 WTIP (MISCELLANEOUS) ×3
IRRIGATION SUCT STRKRFLW 2 WTP (MISCELLANEOUS) ×1 IMPLANT
KIT BASIN OR (CUSTOM PROCEDURE TRAY) ×3 IMPLANT
POUCH SPECIMEN RETRIEVAL 10MM (ENDOMECHANICALS) ×3 IMPLANT
RELOAD 45 VASCULAR/THIN (ENDOMECHANICALS) IMPLANT
RELOAD STAPLE TA45 3.5 REG BLU (ENDOMECHANICALS) ×6 IMPLANT
SCISSORS LAP 5X35 DISP (ENDOMECHANICALS) ×3 IMPLANT
SHEARS HARMONIC ACE PLUS 36CM (ENDOMECHANICALS) ×3 IMPLANT
SLEEVE XCEL OPT CAN 5 100 (ENDOMECHANICALS) ×3 IMPLANT
SPONGE GAUZE 2X2 STER 10/PKG (GAUZE/BANDAGES/DRESSINGS) ×2
STRIP CLOSURE SKIN 1/2X4 (GAUZE/BANDAGES/DRESSINGS) ×2 IMPLANT
SUT ETHILON 3 0 PS 1 (SUTURE) IMPLANT
SUT MNCRL AB 4-0 PS2 18 (SUTURE) ×3 IMPLANT
TOWEL OR 17X26 10 PK STRL BLUE (TOWEL DISPOSABLE) ×3 IMPLANT
TOWEL OR NON WOVEN STRL DISP B (DISPOSABLE) ×3 IMPLANT
TRAY FOLEY W/METER SILVER 14FR (SET/KITS/TRAYS/PACK) IMPLANT
TRAY FOLEY W/METER SILVER 16FR (SET/KITS/TRAYS/PACK) ×3 IMPLANT
TRAY LAPAROSCOPIC (CUSTOM PROCEDURE TRAY) ×3 IMPLANT
TROCAR BLADELESS OPT 5 100 (ENDOMECHANICALS) ×3 IMPLANT
TROCAR XCEL BLUNT TIP 100MML (ENDOMECHANICALS) ×3 IMPLANT
TUBING INSUF HEATED (TUBING) ×3 IMPLANT

## 2016-10-03 NOTE — Discharge Instructions (Signed)
[facility] ______[facility] °LAPAROSCOPIC APPENDECTOMY SURGERY: POST OP INSTRUCTIONS °Always review your discharge instruction sheet given to you by the facility where your surgery was performed. °IF YOU HAVE DISABILITY OR FAMILY LEAVE FORMS, YOU MUST BRING THEM TO THE OFFICE FOR PROCESSING.   °DO NOT GIVE THEM TO YOUR DOCTOR. ° °1. A prescription for pain medication may be given to you upon discharge.  Take your pain medication as prescribed, if needed.  If narcotic pain medicine is not needed, then you may take acetaminophen (Tylenol) or ibuprofen (Advil) as needed. °2. Take your usually prescribed medications unless otherwise directed. °3. If you need a refill on your pain medication, please contact your pharmacy.  They will contact our office to request authorization. Prescriptions will not be filled after 5pm or on week-ends. °4. You should follow a light diet the first few days after arrival home, such as soup and crackers, etc.  Be sure to include lots of fluids daily.  May start lowfat, solid foods 2 days after the surgery. °5. Most patients will experience some swelling and bruising in the area of the incisions.  Ice packs will help.  Swelling and bruising can take several days to resolve.  °6. It is common to experience some constipation if taking pain medication after surgery.  Increasing fluid intake and taking a stool softener (such as Colace) will usually help or prevent this problem from occurring.  A mild laxative (Milk of Magnesia or Miralax) should be taken according to package instructions if there are no bowel movements after 48 hours. °7. Unless discharge instructions indicate otherwise, you may remove your bandages on 3 days after surgery.  You may shower when you get home.  You may have steri-strips (small skin tapes) in place directly over the incision.  These strips should be left on the skin until they fall off.  If your surgeon used skin glue on the incision, you may shower in  24 hours.  The glue will flake off over the next 2-3 weeks.  Any sutures or staples will be removed at the office during your follow-up visit. °8. ACTIVITIES:  You may resume regular (light) daily activities beginning the next day--such as daily self-care, walking, climbing stairs--gradually increasing activities as tolerated.  You may have sexual intercourse when it is comfortable.  Refrain from any heavy lifting or straining for two weeks.  Do not lift anything over 10 pounds during that time.  °a. You may drive when you are no longer taking prescription pain medication, you can comfortably wear a seatbelt, and you can safely maneuver your car and apply brakes. °b. RETURN TO WORK:  Desk type work in 1 week, full duty work in 2 weeks if you are pain-free.________________________________________________________ °9. You should see your doctor or a PA in the office for a follow-up appointment approximately 2-3 weeks after your surgery.  Make sure that you call for this appointment within a day or two after you arrive home to insure a convenient appointment time. °10. OTHER INSTRUCTIONS: __________________________________________________________________________________________________________________________ __________________________________________________________________________________________________________________________ °WHEN TO CALL YOUR DOCTOR: °1. Fever over 101.0 °2. Inability to urinate °3. Continued bleeding from incision. °4. Increased pain, redness, or drainage from the incision. °5. Increasing abdominal pain ° °The clinic staff is available to answer your questions during regular business hours.  Please don’t hesitate to call and ask to speak to one of the nurses for clinical concerns.  If you have a medical emergency, go to the nearest emergency room or call 911.    A surgeon from [facility] is always on call at the hospital. °[address], [address], [place], [place]  [address] ?  [address] [address], [place], [place]   [address] °[phone] ? [phone] ? FAX [phone] °Web site: [email or web address] ° °

## 2016-10-03 NOTE — Op Note (Signed)
Appendectomy, Lap, Procedure Note  Pre-operative Diagnosis: Acute appendicitis  Post-operative Diagnosis: Same  Procedure:  Laparoscopic appendectomy  Surgeon:  Avel Peaceodd Lomax Poehler, M.D.  Anesthesia:  General   Indications: This is a 31 year old male who presented last night with signs symptoms and CT findings consistent with acute uncomplicated appendicitis.  He is brought to the operating room now for appendectomy.   Procedure Details   He was brought to the operating room, placed in the supine position and general anesthesia was induced, along with placement of orogastric tube, SCDs, and a Foley catheter. A timeout was performed. The abdomen was prepped and draped in a sterile fashion. A small infraumbilical incision was made through the skin, subcutaneous tissue, fascia, and peritoneum entering the peritoneal cavity under direct vision.  A pursestring suture was passed around the fascia with a 0 Vicryl.  The Hasson was introduced into the peritoneal cavity and the tails of the suture were used to hold the Hasson in place.   The pneumoperitoneum was then established to steady pressure of 15 mmHg.   The laparoscope was introduced and there was no evidence of bleeding or underlying organ injury. Additional 5 mm cannulas then placed in the left lower quadrant of the abdomen and the right upper quadrant region under direct visualization. A careful evaluation of the entire abdomen was carried out. The patient was placed in Trendelenburg and left lateral decubitus position. The small intestines were retracted in the cephalad and left lateral direction away from the pelvis and right lower quadrant. The patient was found to have an enlarged and inflamed appendix that was retrocecal. There was no evidence of perforation.  The appendix was carefully mobilized. The mesoappendix was was divided with the harmonic scalpel.   The appendix was amputated off the cecum, with a small cuff of cecum, using an  endo-GIA stapler.  The appendix was placed in a retrieval bag and removed through the subumbilical port incision.    There was no evidence of bleeding, leakage, or complication after division of the appendix. Copious irrigation was  performed and irrigant fluid suctioned from the abdomen as much as possible.  The umbilical trocar was removed and the  port site fascia was closed via the purse string suture under laparoscopic vision. There was no residual palpable fascial defect.  The remaining trocars were removed and all  trocar site skin wounds were closed with 4-0 Monocryl.  Steri-Strips and sterile dressings were applied.  He tolerated the procedure well, without apparent complications, and was taken to the recovery room in satisfactory condition.  Instrument, sponge, and needle counts were correct at the conclusion of the case.   Findings: The appendix was found to be inflamed. There were not signs of necrosis.  There was not perforation. There was not abscess formation.  Estimated Blood Loss:  less than 100 mL                Specimens: Appendix                Disposition: PACU - hemodynamically stable.         Condition: stable

## 2016-10-03 NOTE — Anesthesia Postprocedure Evaluation (Addendum)
Anesthesia Post Note  Patient: Jeremiah Caldwell  Procedure(s) Performed: Procedure(s) (LRB): APPENDECTOMY LAPAROSCOPIC (N/A)  Patient location during evaluation: PACU Anesthesia Type: General Level of consciousness: sedated Pain management: pain level controlled Vital Signs Assessment: post-procedure vital signs reviewed and stable Respiratory status: spontaneous breathing and respiratory function stable Cardiovascular status: stable Anesthetic complications: no       Last Vitals:  Vitals:   10/03/16 1046 10/03/16 1047  BP:  123/75  Pulse: 89 94  Resp: 20 (!) 22  Temp:      Last Pain:  Vitals:   10/03/16 1015  TempSrc:   PainSc: 0-No pain                 Vika Buske DANIEL

## 2016-10-03 NOTE — Anesthesia Procedure Notes (Signed)
Procedure Name: Intubation Performed by: Gean Maidens Pre-anesthesia Checklist: Emergency Drugs available, Patient identified, Suction available, Patient being monitored and Timeout performed Patient Re-evaluated:Patient Re-evaluated prior to inductionOxygen Delivery Method: Circle system utilized Preoxygenation: Pre-oxygenation with 100% oxygen (rsi) Intubation Type: IV induction Laryngoscope Size: Mac and 4 Grade View: Grade I Tube type: Oral Tube size: 7.5 mm Number of attempts: 1 Airway Equipment and Method: Stylet Placement Confirmation: ETT inserted through vocal cords under direct vision,  positive ETCO2,  CO2 detector and breath sounds checked- equal and bilateral Secured at: 23 cm Tube secured with: Tape Dental Injury: Teeth and Oropharynx as per pre-operative assessment

## 2016-10-03 NOTE — ED Notes (Signed)
Patient transported to CT 

## 2016-10-03 NOTE — ED Notes (Addendum)
Attempted to call report to floor. Nurse unavailable. They will call back. Dr. Judd Lienelo aware that Flagyl was not given d/t PTAR here to transport. OK to transport pt and make floor aware of order.

## 2016-10-03 NOTE — Transfer of Care (Signed)
Immediate Anesthesia Transfer of Care Note  Patient: Jeremiah Caldwell  Procedure(s) Performed: Procedure(s): APPENDECTOMY LAPAROSCOPIC (N/A)  Patient Location: PACU  Anesthesia Type:General  Level of Consciousness: sedated, patient cooperative and responds to stimulation  Airway & Oxygen Therapy: Patient Spontanous Breathing and Patient connected to face mask oxygen  Post-op Assessment: Report given to RN and Post -op Vital signs reviewed and stable  Post vital signs: Reviewed and stable  Last Vitals:  Vitals:   10/03/16 0358 10/03/16 0611  BP: (!) 145/66 123/65  Pulse: 88 96  Resp: 18 18  Temp: 37.3 C 36.9 C    Last Pain:  Vitals:   10/03/16 0747  TempSrc:   PainSc: 2       Patients Stated Pain Goal: 2 (10/03/16 0747)  Complications: No apparent anesthesia complications

## 2016-10-03 NOTE — ED Notes (Signed)
Tried again to call report to 1519. They stated that pt had been moved to 1614. Called and gave report to Pleasant PlainAmanda. She was made aware of pt's need for Flagyl.

## 2016-10-03 NOTE — H&P (Signed)
Jeremiah Caldwell is an 31 y.o. male.   Chief Complaint:  RLQ abdominal pain. HPI: This is a 31 year old male with well-controlled hypertension who had the onset of right lower quadrant abdominal pain at 5 PM yesterday.  This was followed by episodes of nausea and vomiting.  No fever or chills.  He presented to the [facility] for evaluation.  He was noted to have a leukocytosis and right lower quadrant tenderness.  A CT scan was performed early this morning.  It demonstrated findings consistent with acute appendicitis that was not complicated.  He was transferred to [facility] long for further evaluation and treatment.  His wife is with him.  Past Medical History:  Diagnosis Date  . Hypertension   . Obese     History reviewed. No pertinent surgical history.  Family History  Problem Relation Age of Onset  . Diabetes Sister   . Diabetes Maternal Grandmother   . Cancer Maternal Grandfather   . Heart disease Maternal Grandfather   . Hyperlipidemia Maternal Grandfather   . Hypertension Maternal Grandfather    Social History:  reports that he has been smoking Cigarettes.  He has never used smokeless tobacco. He reports that he does not drink alcohol or use drugs.  Allergies: No Known Allergies  Medications Prior to Admission  Medication Sig Dispense Refill  . losartan-hydrochlorothiazide (HYZAAR) 100-25 MG tablet Take 1 tablet by mouth daily.  0  . hydrochlorothiazide (HYDRODIURIL) 25 MG tablet Take 1 tablet (25 mg total) by mouth daily. (Patient not taking: Reported on 10/03/2016) 90 tablet 3    Results for orders placed or performed during the hospital encounter of 10/02/16 (from the past 48 hour(s))  Urinalysis, Routine w reflex microscopic     Status: None   Collection Time: 10/02/16 10:58 PM  Result Value Ref Range   Color, Urine YELLOW YELLOW   APPearance CLEAR CLEAR   Specific Gravity, Urine 1.021 1.005 - 1.030   pH 5.0 5.0 - 8.0   Glucose, UA NEGATIVE NEGATIVE mg/dL    Hgb urine dipstick NEGATIVE NEGATIVE   Bilirubin Urine NEGATIVE NEGATIVE   Ketones, ur NEGATIVE NEGATIVE mg/dL   Protein, ur NEGATIVE NEGATIVE mg/dL   Nitrite NEGATIVE NEGATIVE   Leukocytes, UA NEGATIVE NEGATIVE    Comment: Microscopic not done on urines with negative protein, blood, leukocytes, nitrite, or glucose < 500 mg/dL.  CBC with Differential     Status: Abnormal   Collection Time: 10/02/16 11:46 PM  Result Value Ref Range   WBC 16.9 (H) 4.0 - 10.5 K/uL   RBC 4.66 4.22 - 5.81 MIL/uL   Hemoglobin 13.8 13.0 - 17.0 g/dL   HCT 39.4 39.0 - 52.0 %   MCV 84.5 78.0 - 100.0 fL   MCH 29.6 26.0 - 34.0 pg   MCHC 35.0 30.0 - 36.0 g/dL   RDW 13.1 11.5 - 15.5 %   Platelets 250 150 - 400 K/uL   Neutrophils Relative % 86 %   Neutro Abs 14.4 (H) 1.7 - 7.7 K/uL   Lymphocytes Relative 8 %   Lymphs Abs 1.4 0.7 - 4.0 K/uL   Monocytes Relative 6 %   Monocytes Absolute 1.0 0.1 - 1.0 K/uL   Eosinophils Relative 0 %   Eosinophils Absolute 0.0 0.0 - 0.7 K/uL   Basophils Relative 0 %   Basophils Absolute 0.0 0.0 - 0.1 K/uL  Comprehensive metabolic panel     Status: Abnormal   Collection Time: 10/02/16 11:46 PM  Result Value  Ref Range   Sodium 138 135 - 145 mmol/L   Potassium 3.5 3.5 - 5.1 mmol/L   Chloride 103 101 - 111 mmol/L   CO2 28 22 - 32 mmol/L   Glucose, Bld 189 (H) 65 - 99 mg/dL   BUN 9 6 - 20 mg/dL   Creatinine, Ser 0.87 0.61 - 1.24 mg/dL   Calcium 9.1 8.9 - 10.3 mg/dL   Total Protein 7.4 6.5 - 8.1 g/dL   Albumin 4.0 3.5 - 5.0 g/dL   AST 23 15 - 41 U/L   ALT 33 17 - 63 U/L   Alkaline Phosphatase 50 38 - 126 U/L   Total Bilirubin 0.7 0.3 - 1.2 mg/dL   GFR calc non Af Amer >60 >60 mL/min   GFR calc Af Amer >60 >60 mL/min    Comment: (NOTE) The eGFR has been calculated using the CKD EPI equation. This calculation has not been validated in all clinical situations. eGFR's persistently <60 mL/min signify possible Chronic Kidney Disease.    Anion gap 7 5 - 15   Ct Abdomen  Pelvis W Contrast  Result Date: 10/03/2016 CLINICAL DATA:  Acute onset right lower quadrant abdominal pain, nausea, vomiting and diarrhea. Leukocytosis. Initial encounter. EXAM: CT ABDOMEN AND PELVIS WITH CONTRAST TECHNIQUE: Multidetector CT imaging of the abdomen and pelvis was performed using the standard protocol following bolus administration of intravenous contrast. CONTRAST:  125m ISOVUE-300 IOPAMIDOL (ISOVUE-300) INJECTION 61% COMPARISON:  Abdominal radiograph performed 10/15/2014 FINDINGS: Lower chest: The visualized lung bases are grossly clear. The visualized portions of the mediastinum are unremarkable. Hepatobiliary: The liver is unremarkable in appearance. The gallbladder is unremarkable in appearance. The common bile duct remains normal in caliber. Pancreas: The pancreas is within normal limits. Spleen:  The spleen is enlarged, measuring 15.6 cm in length. Adrenals/Urinary Tract: The adrenal glands are unremarkable in appearance. The kidneys are within normal limits. There is no evidence of hydronephrosis. No renal or ureteral stones are identified. No perinephric stranding is seen. Stomach/Bowel: The appendix is dilated to 1.2 cm in diameter, with minimal surrounding soft tissue inflammation, and an apparent 6 mm appendicolith at the base of the appendix. The appendix is retrocecal in nature. There is no evidence perforation or abscess formation at this time. The colon is largely decompressed and grossly unremarkable in appearance. Vascular/Lymphatic: The abdominal aorta is unremarkable in appearance. The inferior vena cava is grossly unremarkable. No retroperitoneal lymphadenopathy is seen. No pelvic sidewall lymphadenopathy is identified. Reproductive: The bladder is mildly distended and grossly remarkable. The prostate remains normal in size. Other: No additional soft tissue abnormalities are seen. Musculoskeletal: No acute osseous abnormalities are identified. The visualized musculature is  unremarkable in appearance. IMPRESSION: 1. Mild acute appendicitis, with dilatation of the appendix to 1.2 cm in diameter, minimal surrounding soft tissue inflammation, and an apparent 6 mm appendicolith at the base of the appendix. The appendix is retrocecal in nature. No evidence of perforation or abscess formation at this time. 2. Splenomegaly noted. These results were called by telephone at the time of interpretation on 10/03/2016 at 1:08 am to Dr. DVeryl Speak who verbally acknowledged these results. Electronically Signed   By: JGarald BaldingM.D.   On: 10/03/2016 01:09    Review of Systems  Constitutional: Negative for chills and fever.  HENT:       Positive for headache yesterday.  Respiratory: Negative for shortness of breath.   Cardiovascular: Negative for chest pain.  Gastrointestinal: Positive for abdominal pain, nausea and vomiting.  Negative for blood in stool and diarrhea.  Genitourinary: Negative for dysuria and hematuria.  Musculoskeletal: Positive for back pain.  Endo/Heme/Allergies:       No bleeding disorders or blood clots.    Blood pressure 123/65, pulse 96, temperature 98.4 F (36.9 C), temperature source Oral, resp. rate 18, height 6' (1.829 m), weight (!) 141.8 kg (312 lb 9.8 oz), SpO2 98 %. Physical Exam  GENERAL APPEARANCE:  Obese male in NAD.  Pleasant and cooperative.  EARS, NOSE, MOUTH THROAT:  Bieber/AT external ears:  no lesions or deformities external nose:  no lesions or deformities hearing:  grossly normal lips:  moist, no deformities EYES external: conjunctiva, lids, sclerae normal pupils:  equal, round glasses: yes  NECK:  Supple, thick, no obvious mass or thyroid mass/enlargement, no trachea deviation  CV ascultation:  RRR, no murmur extremity edema:  no extremity varicosities:  no carotid bruit:  no  RESP auscultation:  breath sounds equal and clear respiratory effort:  normal    GASTROINTESTINAL abdomen:  Soft, obese, right lower quadrant  tenderness, no masses. hernia:  none present scar:  none present  MUSCULOSKELETAL digits/nails: no clubbing or cyanosis deformities:  none   NEUROLOGIC sensation:  intact to touch speech:  normal  PSYCHIATRIC alertness and orientation:  normal mood/affect/behavior:  normal judgement and insight:  normal   Assessment/Plan Acute appendicitis-IV antibiotics have been started.  Plan: Laparoscopic possible open appendectomy.I have discussed the procedure and risks of appendectomy. The risks include but are not limited to bleeding, infection, wound problems, anesthesia, injury to intra-abdominal organs, possibility of postoperative ileus. He seems to understand and agrees with the plan.  Odis Hollingshead, MD 10/03/2016, 6:46 AM

## 2016-10-03 NOTE — Anesthesia Preprocedure Evaluation (Addendum)
Anesthesia Evaluation  Patient identified by MRN, date of birth, ID band Patient awake    Reviewed: Allergy & Precautions, NPO status , Patient's Chart, lab work & pertinent test results  History of Anesthesia Complications Negative for: history of anesthetic complications  Airway Mallampati: II  TM Distance: >3 FB Neck ROM: Full    Dental  (+) Dental Advisory Given, Poor Dentition   Pulmonary Current Smoker,    Pulmonary exam normal        Cardiovascular hypertension, Pt. on medications negative cardio ROS Normal cardiovascular exam     Neuro/Psych negative neurological ROS  negative psych ROS   GI/Hepatic Neg liver ROS,   Endo/Other  Morbid obesity  Renal/GU negative Renal ROS  negative genitourinary   Musculoskeletal negative musculoskeletal ROS (+)   Abdominal   Peds negative pediatric ROS (+)  Hematology negative hematology ROS (+)   Anesthesia Other Findings   Reproductive/Obstetrics negative OB ROS                           Anesthesia Physical Anesthesia Plan  ASA: III and emergent  Anesthesia Plan: General   Post-op Pain Management:    Induction: Intravenous, Rapid sequence and Cricoid pressure planned  Airway Management Planned: Oral ETT  Additional Equipment:   Intra-op Plan:   Post-operative Plan: Extubation in OR  Informed Consent: I have reviewed the patients History and Physical, chart, labs and discussed the procedure including the risks, benefits and alternatives for the proposed anesthesia with the patient or authorized representative who has indicated his/her understanding and acceptance.   Dental advisory given  Plan Discussed with: CRNA, Anesthesiologist and Surgeon  Anesthesia Plan Comments:        Anesthesia Quick Evaluation

## 2016-10-04 ENCOUNTER — Encounter (HOSPITAL_COMMUNITY): Payer: Self-pay | Admitting: General Surgery

## 2016-10-04 MED ORDER — HYDROCODONE-ACETAMINOPHEN 5-325 MG PO TABS: 1.0000 | ORAL_TABLET | Freq: Four times a day (QID) | ORAL | 0 refills | Status: AC | PRN

## 2016-10-04 MED ORDER — ONDANSETRON 4 MG PO TBDP: 4.0000 mg | ORAL_TABLET | Freq: Four times a day (QID) | ORAL | 0 refills | Status: AC | PRN

## 2016-10-04 NOTE — Discharge Summary (Signed)
[facility] Discharge Summary   Patient ID: Jeremiah Caldwell MRN: [id number] DOB/AGE: [birth year] 31 y.o.  Admit date: 10/02/2016 Discharge date: 10/04/2016  Admitting Diagnosis: Acute appendicitis  Discharge Diagnosis Patient Active Problem List   Diagnosis Date Noted  . Acute appendicitis 10/03/2016    Consultants None  Imaging: Ct Abdomen Pelvis W Contrast  Result Date: 10/03/2016 CLINICAL DATA:  Acute onset right lower quadrant abdominal pain, nausea, vomiting and diarrhea. Leukocytosis. Initial encounter. EXAM: CT ABDOMEN AND PELVIS WITH CONTRAST TECHNIQUE: Multidetector CT imaging of the abdomen and pelvis was performed using the standard protocol following bolus administration of intravenous contrast. CONTRAST:  100mL ISOVUE-300 IOPAMIDOL (ISOVUE-300) INJECTION 61% COMPARISON:  Abdominal radiograph performed 10/15/2014 FINDINGS: Lower chest: The visualized lung bases are grossly clear. The visualized portions of the mediastinum are unremarkable. Hepatobiliary: The liver is unremarkable in appearance. The gallbladder is unremarkable in appearance. The common bile duct remains normal in caliber. Pancreas: The pancreas is within normal limits. Spleen:  The spleen is enlarged, measuring 15.6 cm in length. Adrenals/Urinary Tract: The adrenal glands are unremarkable in appearance. The kidneys are within normal limits. There is no evidence of hydronephrosis. No renal or ureteral stones are identified. No perinephric stranding is seen. Stomach/Bowel: The appendix is dilated to 1.2 cm in diameter, with minimal surrounding soft tissue inflammation, and an apparent 6 mm appendicolith at the base of the appendix. The appendix is retrocecal in nature. There is no evidence perforation or abscess formation at this time. The colon is largely decompressed and grossly unremarkable in appearance. Vascular/Lymphatic: The abdominal aorta is unremarkable in appearance. The inferior vena cava is  grossly unremarkable. No retroperitoneal lymphadenopathy is seen. No pelvic sidewall lymphadenopathy is identified. Reproductive: The bladder is mildly distended and grossly remarkable. The prostate remains normal in size. Other: No additional soft tissue abnormalities are seen. Musculoskeletal: No acute osseous abnormalities are identified. The visualized musculature is unremarkable in appearance. IMPRESSION: 1. Mild acute appendicitis, with dilatation of the appendix to 1.2 cm in diameter, minimal surrounding soft tissue inflammation, and an apparent 6 mm appendicolith at the base of the appendix. The appendix is retrocecal in nature. No evidence of perforation or abscess formation at this time. 2. Splenomegaly noted. These results were called by telephone at the time of interpretation on 10/03/2016 at 1:08 am to Dr. Geoffery LyonsUGLAS DELO, who verbally acknowledged these results. Electronically Signed   By: Roanna RaiderJeffery  Chang M.D.   On: 10/03/2016 01:09    Procedures Dr. Abbey Chattersosenbower (10/03/16) - Laparoscopic Appendectomy  Hospital Course:  Jeremiah Caldwell is a 31yo male [facility] with acute onset RLQ abdominal pain, nausea, and vomiting.  Workup showed leukocytosis and acute appendicitis.  Patient was admitted and underwent procedure listed above.  Tolerated procedure well and was transferred to the floor.  Diet was advanced as tolerated.  On POD1 the patient was voiding well, tolerating diet, ambulating well, pain well controlled, vital signs stable, incisions c/d/i and felt stable for discharge home.  Patient will follow up in our office in 2-3weeks and knows to call with questions or concerns.  He will call to confirm appointment date/time.    I have personally reviewed the patients medication history on the [place] controlled substance database.   Physical Exam: General:  Alert, NAD, pleasant, comfortable Pulm: effort normal Abd:  Soft, ND, appropriately tender, multiple lap incisions covered with  clean/dry dressings   Allergies as of 10/04/2016   No Known Allergies     Medication List  TAKE these medications   hydrochlorothiazide 25 MG tablet Commonly known as:  HYDRODIURIL Take 1 tablet (25 mg total) by mouth daily.   HYDROcodone-acetaminophen 5-325 MG tablet Commonly known as:  NORCO/VICODIN Take 1-2 tablets by mouth every 6 (six) hours as needed for moderate pain.   losartan-hydrochlorothiazide 100-25 MG tablet Commonly known as:  HYZAAR Take 1 tablet by mouth daily.   ondansetron 4 MG disintegrating tablet Commonly known as:  ZOFRAN-ODT Take 1 tablet (4 mg total) by mouth every 6 (six) hours as needed for nausea.        Follow-up Information    [facility], [place]. Schedule an appointment as soon as possible for a visit in 3 week(s).   Specialty:  General Surgery Why:  Please call as soon as you leave the hospital to make an appointment in our D[facility] clinic in 3 weeks Contact information: [address] [place] [address] [phone]          Signed: Edson Snowball, [facility] 10/04/2016, 8:02 AM Pager: [phone] Consults: [phone] Mon-Fri 7:00 am-4:30 pm Sat-Sun 7:00 am-11:30 am

## 2016-10-04 NOTE — Progress Notes (Signed)
RN reviewed discharge instructions with patient and family. All questions answered.   Paperwork, work note, and prescriptions given.   NT rolled patient down with all belongings to family car.

## 2017-01-01 NOTE — Addendum Note (Signed)
Addendum  created 01/01/17 1013 by Ukiah Trawick, MD   Sign clinical note    

## 2017-07-20 IMAGING — CT CT ABD-PELV W/ CM
2 of 4 series · 15 of 46 positions shown, 17 images · IV contrast (APPLIED)
Comparison: Abdominal radiograph performed 10/15/2014

CLINICAL DATA: Acute onset right lower quadrant abdominal pain,
nausea, vomiting and diarrhea. Leukocytosis. Initial encounter.

EXAM:
CT ABDOMEN AND PELVIS WITH CONTRAST
TECHNIQUE: Multidetector CT imaging of the abdomen and pelvis was performed
using the standard protocol following bolus administration of
intravenous contrast.
CONTRAST:  100mL UE2NNB-DPP IOPAMIDOL (UE2NNB-DPP) INJECTION 61%

[Series 2: axial st · axial · 0.89mm/px · z∈[+209,+679]mm · 12 of 108 slices shown, 14 images]
[im 9/108  soft-tissue]
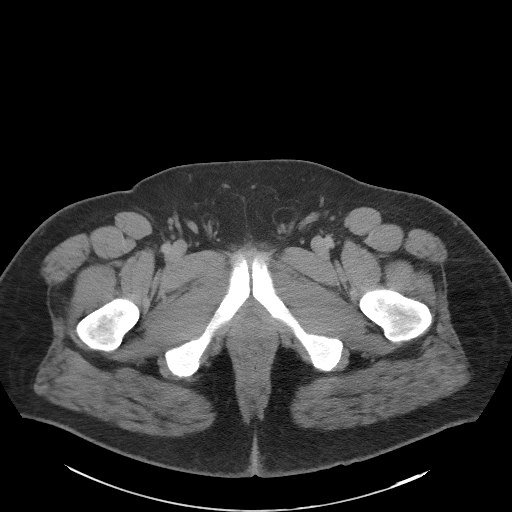
[im 9/108  bone]
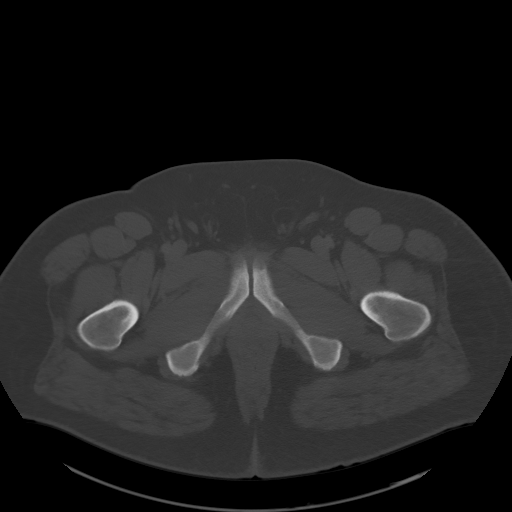
[im 18/108  soft-tissue]
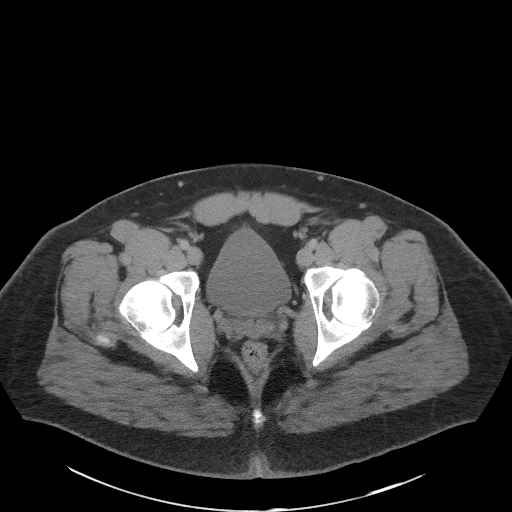
[im 26/108  soft-tissue]
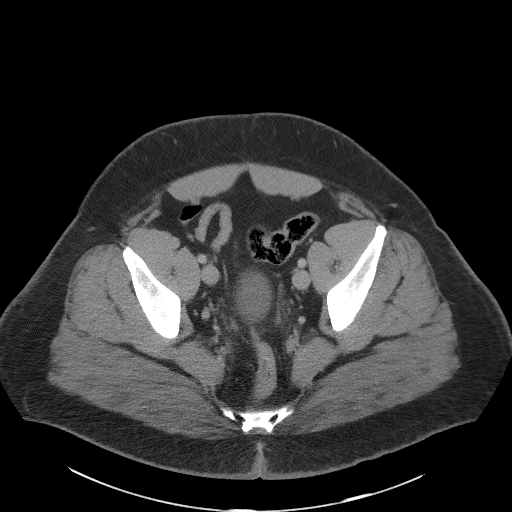
[im 35/108  soft-tissue]
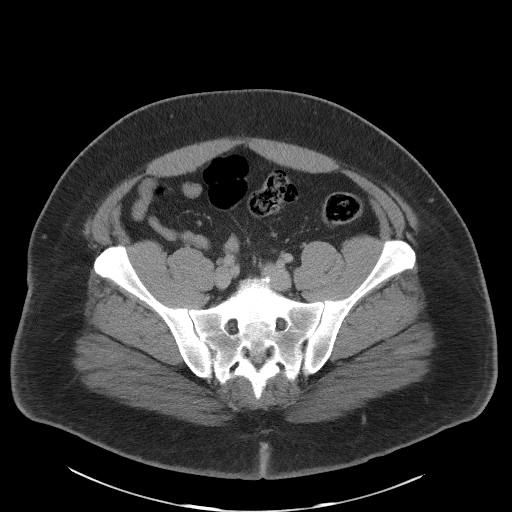
[im 43/108  soft-tissue]
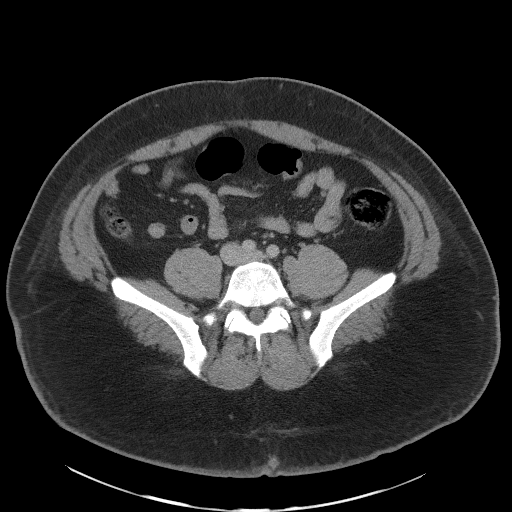
[im 52/108  soft-tissue]
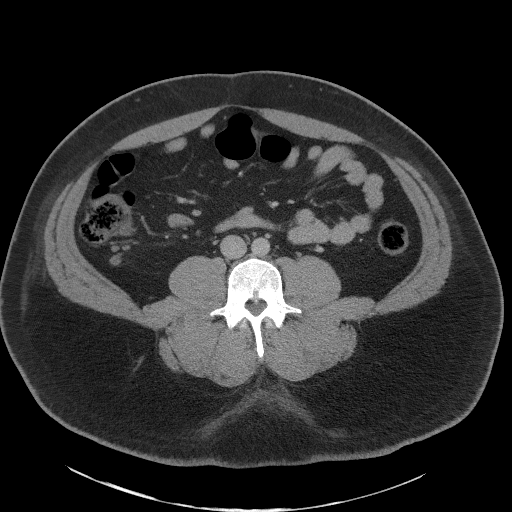
[im 60/108  soft-tissue]
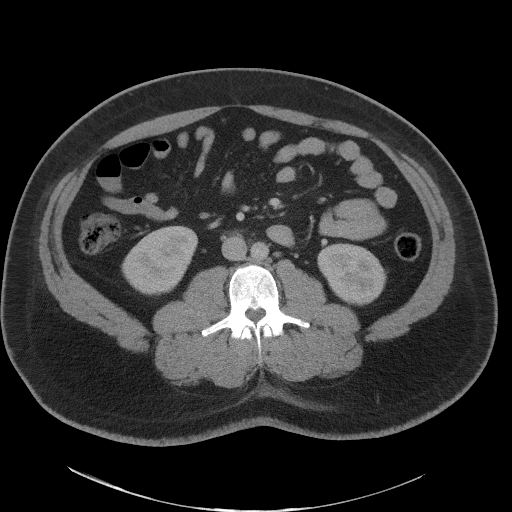
[im 69/108  soft-tissue]
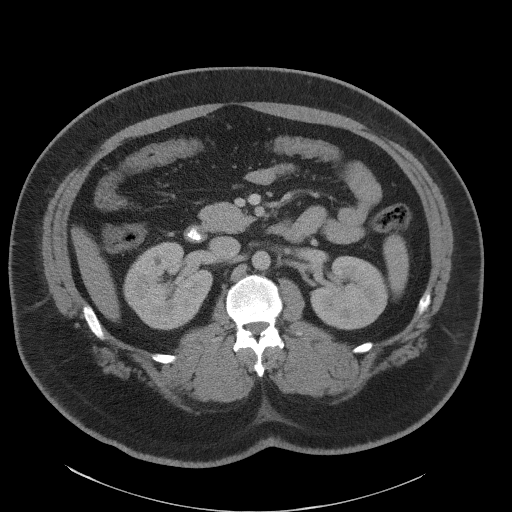
[im 78/108  soft-tissue]
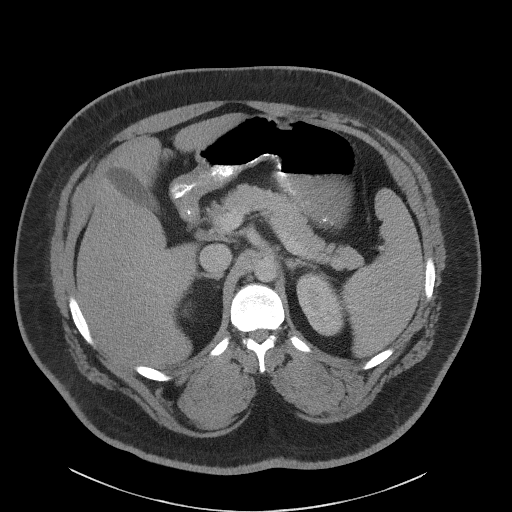
[im 78/108  bone]
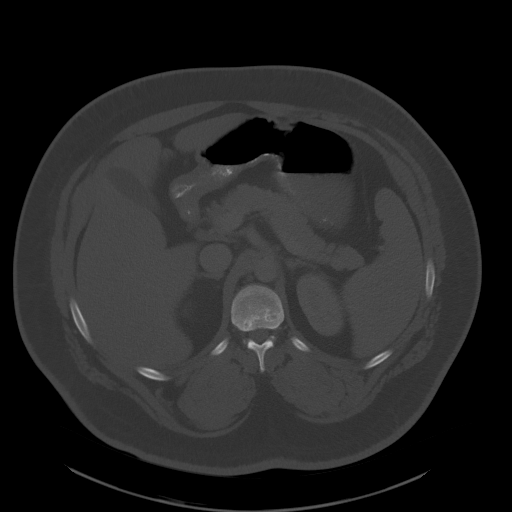
[im 86/108  soft-tissue]
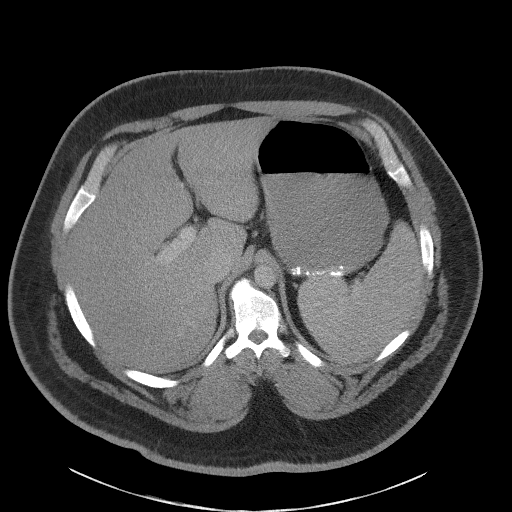
[im 95/108  soft-tissue]
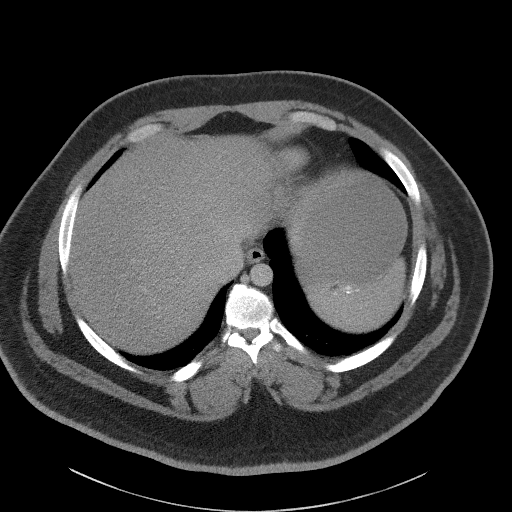
[im 103/108  soft-tissue]
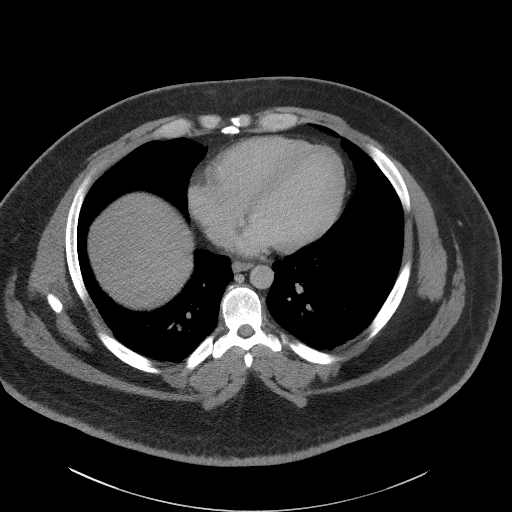

[Series 5: coronal st · coronal · 0.84mm/px · 3 of 125 slices shown]
[im 42/125  soft-tissue]
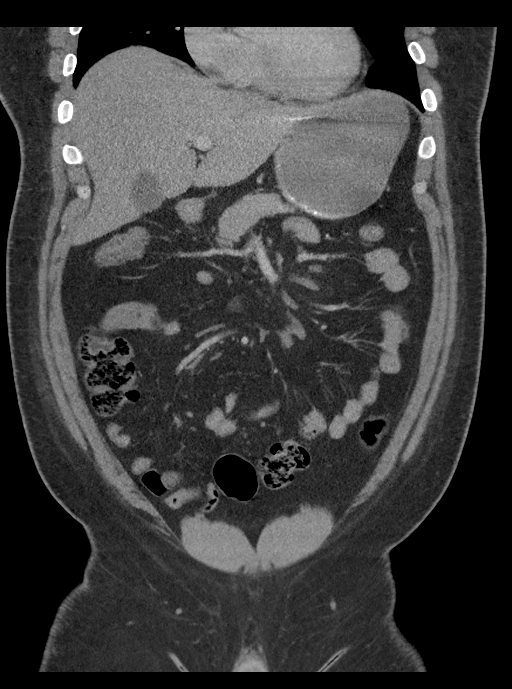
[im 56/125  soft-tissue]
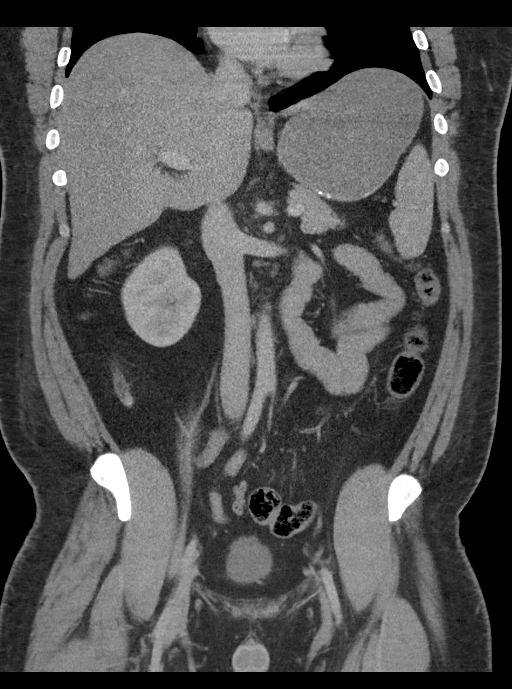
[im 69/125  soft-tissue]
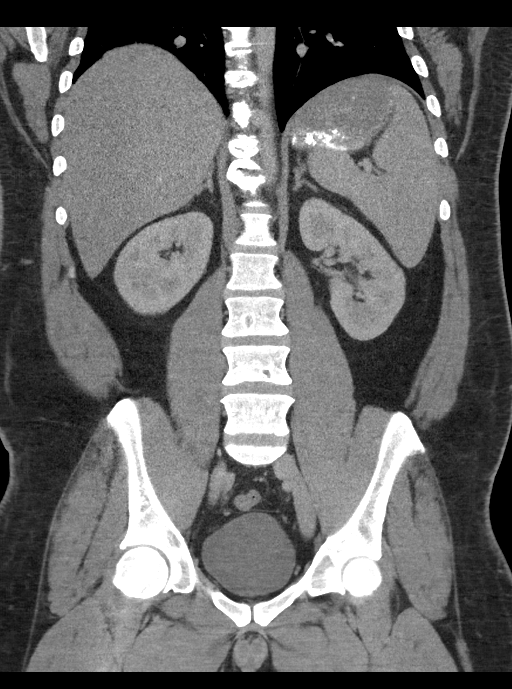

[15 of 46 positions shown; findings below may reference images not displayed]

FINDINGS: Lower chest: The visualized lung bases are grossly clear. The
visualized portions of the mediastinum are unremarkable.

Hepatobiliary: The liver is unremarkable in appearance. The
gallbladder is unremarkable in appearance. The common bile duct
remains normal in caliber.

Pancreas: The pancreas is within normal limits.

Spleen:  The spleen is enlarged, measuring 15.6 cm in length.

Adrenals/Urinary Tract: The adrenal glands are unremarkable in
appearance. The kidneys are within normal limits. There is no
evidence of hydronephrosis. No renal or ureteral stones are
identified. No perinephric stranding is seen.

Stomach/Bowel: The appendix is dilated to 1.2 cm in diameter, with
minimal surrounding soft tissue inflammation, and an apparent 6 mm
appendicolith at the base of the appendix. The appendix is
retrocecal in nature. There is no evidence perforation or abscess
formation at this time.

The colon is largely decompressed and grossly unremarkable in
appearance.

Vascular/Lymphatic: The abdominal aorta is unremarkable in
appearance. The inferior vena cava is grossly unremarkable. No
retroperitoneal lymphadenopathy is seen. No pelvic sidewall
lymphadenopathy is identified.

Reproductive: The bladder is mildly distended and grossly
remarkable. The prostate remains normal in size.

Other: No additional soft tissue abnormalities are seen.

Musculoskeletal: No acute osseous abnormalities are identified. The
visualized musculature is unremarkable in appearance.
IMPRESSION: 1. Mild acute appendicitis, with dilatation of the appendix to
cm in diameter, minimal surrounding soft tissue inflammation, and an
apparent 6 mm appendicolith at the base of the appendix. The
appendix is retrocecal in nature. No evidence of perforation or
abscess formation at this time.
2. Splenomegaly noted.
These results were called by telephone at the time of interpretation
on 10/03/2016 at [DATE] to Dr. FELIX ARNALDO NEDD, who verbally
acknowledged these results.
